# Patient Record
Sex: Female | Born: 1985 | Race: Black or African American | Hispanic: No | Smoking: Never smoker
Health system: Southern US, Community
[De-identification: ages and names within clinical notes are randomized; demographics above are authoritative.]

## PROBLEM LIST (undated history)

## (undated) DIAGNOSIS — E669 Obesity, unspecified: Secondary | ICD-10-CM

## (undated) DIAGNOSIS — R87629 Unspecified abnormal cytological findings in specimens from vagina: Secondary | ICD-10-CM

## (undated) DIAGNOSIS — N879 Dysplasia of cervix uteri, unspecified: Secondary | ICD-10-CM

## (undated) HISTORY — DX: Unspecified abnormal cytological findings in specimens from vagina: R87.629

## (undated) HISTORY — DX: Obesity, unspecified: E66.9

## (undated) HISTORY — PX: COLPOSCOPY: SHX161

## (undated) HISTORY — PX: CERVICAL BIOPSY  W/ LOOP ELECTRODE EXCISION: SUR135

## (undated) HISTORY — DX: Dysplasia of cervix uteri, unspecified: N87.9

---
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
## 2007-12-15 ENCOUNTER — Emergency Department: Payer: Self-pay | Admitting: Emergency Medicine

## 2007-12-16 ENCOUNTER — Emergency Department: Payer: Self-pay | Admitting: Internal Medicine

## 2008-01-14 ENCOUNTER — Emergency Department: Payer: Self-pay | Admitting: Emergency Medicine

## 2008-03-08 ENCOUNTER — Emergency Department: Payer: Self-pay | Admitting: Emergency Medicine

## 2008-09-05 ENCOUNTER — Emergency Department: Payer: Self-pay | Admitting: Emergency Medicine

## 2009-09-13 ENCOUNTER — Emergency Department: Payer: Self-pay | Admitting: Unknown Physician Specialty

## 2011-10-29 ENCOUNTER — Emergency Department: Payer: Self-pay | Admitting: Emergency Medicine

## 2012-01-28 ENCOUNTER — Emergency Department: Payer: Self-pay | Admitting: Emergency Medicine

## 2013-02-09 ENCOUNTER — Emergency Department: Payer: Self-pay | Admitting: Emergency Medicine

## 2013-02-09 LAB — URINALYSIS, COMPLETE
Glucose,UR: NEGATIVE mg/dL (ref 0–75)
Ketone: NEGATIVE
Nitrite: NEGATIVE
Ph: 5 (ref 4.5–8.0)
Specific Gravity: 1.027 (ref 1.003–1.030)

## 2013-02-09 LAB — COMPREHENSIVE METABOLIC PANEL
Anion Gap: 6 — ABNORMAL LOW (ref 7–16)
BUN: 19 mg/dL — ABNORMAL HIGH (ref 7–18)
Calcium, Total: 8.8 mg/dL (ref 8.5–10.1)
Chloride: 105 mmol/L (ref 98–107)
Creatinine: 0.96 mg/dL (ref 0.60–1.30)
EGFR (Non-African Amer.): >60
Glucose: 92 mg/dL (ref 65–99)
Osmolality: 278 (ref 275–301)
Potassium: 3.9 mmol/L (ref 3.5–5.1)
Sodium: 138 mmol/L (ref 136–145)
Total Protein: 8 g/dL (ref 6.4–8.2)

## 2013-02-09 LAB — PREGNANCY, URINE: Pregnancy Test, Urine: NEGATIVE m[IU]/mL

## 2013-02-09 LAB — CBC
HCT: 39.3 % (ref 35.0–47.0)
MCHC: 32.6 g/dL (ref 32.0–36.0)
MCV: 88 fL (ref 80–100)
RBC: 4.47 10*6/uL (ref 3.80–5.20)
RDW: 14.3 % (ref 11.5–14.5)

## 2013-05-19 ENCOUNTER — Emergency Department: Payer: Self-pay | Admitting: Emergency Medicine

## 2013-05-19 LAB — URINALYSIS, COMPLETE
Bilirubin,UR: NEGATIVE
Blood: NEGATIVE
Nitrite: NEGATIVE
Ph: 7 (ref 4.5–8.0)
Protein: NEGATIVE
Specific Gravity: 1.021 (ref 1.003–1.030)
Squamous Epithelial: 6
WBC UR: 3 /HPF (ref 0–5)

## 2013-05-19 LAB — PREGNANCY, URINE: Pregnancy Test, Urine: NEGATIVE m[IU]/mL

## 2013-05-19 LAB — GC/CHLAMYDIA PROBE AMP

## 2013-05-19 LAB — WET PREP, GENITAL

## 2014-02-09 LAB — HM HIV SCREENING LAB: HM HIV Screening: NEGATIVE

## 2014-10-27 ENCOUNTER — Encounter (HOSPITAL_COMMUNITY): Payer: Self-pay | Admitting: Emergency Medicine

## 2014-10-27 DIAGNOSIS — Z3202 Encounter for pregnancy test, result negative: Secondary | ICD-10-CM | POA: Insufficient documentation

## 2014-10-27 DIAGNOSIS — R110 Nausea: Secondary | ICD-10-CM | POA: Insufficient documentation

## 2014-10-27 DIAGNOSIS — N760 Acute vaginitis: Secondary | ICD-10-CM | POA: Insufficient documentation

## 2014-10-27 NOTE — ED Notes (Signed)
Pt. reports LLQ / left lateral abdominal pain onset yesterday with nausea , denies emesis or diarrhea , no dysuria , mild vaginal itching .

## 2014-10-28 ENCOUNTER — Emergency Department (HOSPITAL_COMMUNITY)
Admission: EM | Admit: 2014-10-28 | Discharge: 2014-10-28 | Disposition: A | Discharge status: Home / Self Care | Payer: Self-pay | Attending: Emergency Medicine | Admitting: Emergency Medicine

## 2014-10-28 DIAGNOSIS — B9689 Other specified bacterial agents as the cause of diseases classified elsewhere: Secondary | ICD-10-CM

## 2014-10-28 DIAGNOSIS — R102 Pelvic and perineal pain: Secondary | ICD-10-CM

## 2014-10-28 DIAGNOSIS — N760 Acute vaginitis: Secondary | ICD-10-CM

## 2014-10-28 LAB — CBC WITH DIFFERENTIAL/PLATELET
Basophils Absolute: 0 10*3/uL (ref 0.0–0.1)
Basophils Relative: 0 % (ref 0–1)
Eosinophils Absolute: 0.1 10*3/uL (ref 0.0–0.7)
Eosinophils Relative: 2 % (ref 0–5)
HEMATOCRIT: 37.6 % (ref 36.0–46.0)
Hemoglobin: 12.3 g/dL (ref 12.0–15.0)
LYMPHS ABS: 2.5 10*3/uL (ref 0.7–4.0)
LYMPHS PCT: 48 % — AB (ref 12–46)
MCH: 27.6 pg (ref 26.0–34.0)
MCHC: 32.7 g/dL (ref 30.0–36.0)
MCV: 84.3 fL (ref 78.0–100.0)
Monocytes Absolute: 0.3 10*3/uL (ref 0.1–1.0)
Monocytes Relative: 5 % (ref 3–12)
NEUTROS ABS: 2.3 10*3/uL (ref 1.7–7.7)
Neutrophils Relative %: 45 % (ref 43–77)
Platelets: 204 10*3/uL (ref 150–400)
RBC: 4.46 MIL/uL (ref 3.87–5.11)
RDW: 13 % (ref 11.5–15.5)
WBC: 5.2 10*3/uL (ref 4.0–10.5)

## 2014-10-28 LAB — WET PREP, GENITAL
Trich, Wet Prep: NONE SEEN
YEAST WET PREP: NONE SEEN

## 2014-10-28 LAB — URINALYSIS, ROUTINE W REFLEX MICROSCOPIC
Bilirubin Urine: NEGATIVE
GLUCOSE, UA: NEGATIVE mg/dL
KETONES UR: NEGATIVE mg/dL
LEUKOCYTES UA: NEGATIVE
Nitrite: NEGATIVE
PH: 5.5 (ref 5.0–8.0)
Protein, ur: NEGATIVE mg/dL
Specific Gravity, Urine: 1.02 (ref 1.005–1.030)
Urobilinogen, UA: 1 mg/dL (ref 0.0–1.0)

## 2014-10-28 LAB — COMPREHENSIVE METABOLIC PANEL
ALK PHOS: 49 U/L (ref 39–117)
ALT: 10 U/L (ref 0–35)
AST: 15 U/L (ref 0–37)
Albumin: 3.7 g/dL (ref 3.5–5.2)
Anion gap: 12 (ref 5–15)
BUN: 11 mg/dL (ref 6–23)
CHLORIDE: 102 meq/L (ref 96–112)
CO2: 25 meq/L (ref 19–32)
Calcium: 9.1 mg/dL (ref 8.4–10.5)
Creatinine, Ser: 1.04 mg/dL (ref 0.50–1.10)
GFR calc non Af Amer: 73 mL/min — ABNORMAL LOW (ref >90)
GFR, EST AFRICAN AMERICAN: 85 mL/min — AB (ref >90)
GLUCOSE: 95 mg/dL (ref 70–99)
Potassium: 3.8 mEq/L (ref 3.7–5.3)
SODIUM: 139 meq/L (ref 137–147)
Total Bilirubin: 0.4 mg/dL (ref 0.3–1.2)
Total Protein: 7 g/dL (ref 6.0–8.3)

## 2014-10-28 LAB — HIV ANTIBODY (ROUTINE TESTING W REFLEX): HIV: NONREACTIVE

## 2014-10-28 LAB — URINE MICROSCOPIC-ADD ON

## 2014-10-28 LAB — RPR

## 2014-10-28 LAB — PREGNANCY, URINE: Preg Test, Ur: NEGATIVE

## 2014-10-28 MED ORDER — TRAMADOL HCL 50 MG PO TABS: 50.0000 mg | ORAL_TABLET | Freq: Four times a day (QID) | ORAL | Status: DC | PRN

## 2014-10-28 MED ORDER — METRONIDAZOLE 500 MG PO TABS: 500.0000 mg | ORAL_TABLET | Freq: Two times a day (BID) | ORAL | Status: DC

## 2014-10-28 NOTE — Discharge Instructions (Signed)
Bacterial Vaginosis Bacterial vaginosis is a vaginal infection that occurs when the normal balance of bacteria in the vagina is disrupted. It results from an overgrowth of certain bacteria. This is the most common vaginal infection in women of childbearing age. Treatment is important to prevent complications, especially in pregnant women, as it can cause a premature delivery. CAUSES  Bacterial vaginosis is caused by an increase in harmful bacteria that are normally present in smaller amounts in the vagina. Several different kinds of bacteria can cause bacterial vaginosis. However, the reason that the condition develops is not fully understood. RISK FACTORS Certain activities or behaviors can put you at an increased risk of developing bacterial vaginosis, including:  Having a new sex partner or multiple sex partners.  Douching.  Using an intrauterine device (IUD) for contraception. Women do not get bacterial vaginosis from toilet seats, bedding, swimming pools, or contact with objects around them. SIGNS AND SYMPTOMS  Some women with bacterial vaginosis have no signs or symptoms. Common symptoms include:  Grey vaginal discharge.  A fishlike odor with discharge, especially after sexual intercourse.  Itching or burning of the vagina and vulva.  Burning or pain with urination. DIAGNOSIS  Your health care provider will take a medical history and examine the vagina for signs of bacterial vaginosis. A sample of vaginal fluid may be taken. Your health care provider will look at this sample under a microscope to check for bacteria and abnormal cells. A vaginal pH test may also be done.  TREATMENT  Bacterial vaginosis may be treated with antibiotic medicines. These may be given in the form of a pill or a vaginal cream. A second round of antibiotics may be prescribed if the condition comes back after treatment.  HOME CARE INSTRUCTIONS   Only take over-the-counter or prescription medicines as  directed by your health care provider.  If antibiotic medicine was prescribed, take it as directed. Make sure you finish it even if you start to feel better.  Do not have sex until treatment is completed.  Tell all sexual partners that you have a vaginal infection. They should see their health care provider and be treated if they have problems, such as a mild rash or itching.  Practice safe sex by using condoms and only having one sex partner. SEEK MEDICAL CARE IF:   Your symptoms are not improving after 3 days of treatment.  You have increased discharge or pain.  You have a fever. MAKE SURE YOU:   Understand these instructions.  Will watch your condition.  Will get help right away if you are not doing well or get worse. FOR MORE INFORMATION  Centers for Disease Control and Prevention, Division of STD Prevention: Appraiser[employer]raud[employer]fi American Sexual Health Association (ASHA): www.ashastd.org  Document Released: 12/03/2005 Document Revised: 09/23/2013 Document Reviewed: 07/15/2013 [facility] Patient Information 2015 [place], [place]. This information is not intended to replace advice given to you by your health care provider. Make sure you discuss any questions you have with your health care provider.  Pelvic Pain Female pelvic pain can be caused by many different things and start from a variety of places. Pelvic pain refers to pain that is located in the lower half of the abdomen and between your hips. The pain may occur over a short period of time (acute) or may be reoccurring (chronic). The cause of pelvic pain may be related to disorders affecting the female reproductive organs (gynecologic), but it may also be related to the bladder, kidney stones, an intestinal complication,  or muscle or skeletal problems. Getting help right away for pelvic pain is important, especially if there has been severe, sharp, or a sudden onset of unusual pain. It is also important to get help right away because  some types of pelvic pain can be life threatening.  CAUSES  Below are only some of the causes of pelvic pain. The causes of pelvic pain can be in one of several categories.   Gynecologic.  Pelvic inflammatory disease.  Sexually transmitted infection.  Ovarian cyst or a twisted ovarian ligament (ovarian torsion).  Uterine lining that grows outside the uterus (endometriosis).  Fibroids, cysts, or tumors.  Ovulation.  Pregnancy.  Pregnancy that occurs outside the uterus (ectopic pregnancy).  Miscarriage.  Labor.  Abruption of the placenta or ruptured uterus.  Infection.  Uterine infection (endometritis).  Bladder infection.  Diverticulitis.  Miscarriage related to a uterine infection (septic abortion).  Bladder.  Inflammation of the bladder (cystitis).  Kidney stone(s).  Gastrointestinal.  Constipation.  Diverticulitis.  Neurologic.  Trauma.  Feeling pelvic pain because of mental or emotional causes (psychosomatic).  Cancers of the bowel or pelvis. EVALUATION  Your caregiver will want to take a careful history of your concerns. This includes recent changes in your health, a careful gynecologic history of your periods (menses), and a sexual history. Obtaining your family history and medical history is also important. Your caregiver may suggest a pelvic exam. A pelvic exam will help identify the location and severity of the pain. It also helps in the evaluation of which organ system may be involved. In order to identify the cause of the pelvic pain and be properly treated, your caregiver may order tests. These tests may include:   A pregnancy test.  Pelvic ultrasonography.  An X-ray exam of the abdomen.  A urinalysis or evaluation of vaginal discharge.  Blood tests. HOME CARE INSTRUCTIONS   Only take over-the-counter or prescription medicines for pain, discomfort, or fever as directed by your caregiver.   Rest as directed by your caregiver.    Eat a balanced diet.   Drink enough fluids to make your urine clear or pale yellow, or as directed.   Avoid sexual intercourse if it causes pain.   Apply warm or cold compresses to the lower abdomen depending on which one helps the pain.   Avoid stressful situations.   Keep a journal of your pelvic pain. Write down when it started, where the pain is located, and if there are things that seem to be associated with the pain, such as food or your menstrual cycle.  Follow up with your caregiver as directed.  SEEK MEDICAL CARE IF:  Your medicine does not help your pain.  You have abnormal vaginal discharge. SEEK IMMEDIATE MEDICAL CARE IF:   You have heavy bleeding from the vagina.   Your pelvic pain increases.   You feel light-headed or faint.   You have chills.   You have pain with urination or blood in your urine.   You have uncontrolled diarrhea or vomiting.   You have a fever or persistent symptoms for more than 3 days.  You have a fever and your symptoms suddenly get worse.   You are being physically or sexually abused.  MAKE SURE YOU:  Understand these instructions.  Will watch your condition.  Will get help if you are not doing well or get worse. Document Released: 10/30/2004 Document Revised: 04/19/2014 Document Reviewed: 03/24/2012 [facility] Patient Information 2015 [place], [place]. This information is not intended to  replace advice given to you by your health care provider. Make sure you discuss any questions you have with your health care provider. ° °

## 2014-10-28 NOTE — ED Notes (Signed)
Phlebotomy at the bedside  

## 2014-10-28 NOTE — ED Notes (Signed)
Patient is alert and orientedx4.  Patient was explained discharge instructions and they understood them with no questions.   

## 2014-10-28 NOTE — ED Provider Notes (Signed)
CSN: [id number]     Arrival date & time 10/27/14  2332 History   First MD Initiated Contact with Patient 10/28/14 0051     Chief Complaint  Patient presents with  . Abdominal Pain     (Consider location/radiation/quality/duration/timing/severity/associated sxs/prior Treatment) HPI Comments: Patient presents to the ER for evaluation of abdominal and pelvic pain. Patient reports she started to feel some discomfort yesterday, then this morning it worsened. She was experiencing intermittent pain in the left lower abdomen and pelvis through the course of the morning and then this evening the pain became more intense and constant. She has not had any vomiting but did feel nausea. No diarrhea or constipation. She has not had any urinary symptoms. Denies unusual vaginal bleeding, has had a slight discharge and itching. She has not noticed any fevers.  Patient is a 28 y.o. female presenting with abdominal pain.  Abdominal Pain Associated symptoms: vaginal discharge   Associated symptoms: no fever     History reviewed. No pertinent past medical history. History reviewed. No pertinent past surgical history. No family history on file. History  Substance Use Topics  . Smoking status: Never Smoker   . Smokeless tobacco: Not on file  . Alcohol Use: No   OB History    No data available     Review of Systems  Constitutional: Negative for fever.  Gastrointestinal: Positive for abdominal pain.  Genitourinary: Positive for vaginal discharge.  All other systems reviewed and are negative.     Allergies  Review of patient's allergies indicates no known allergies.  Home Medications   Prior to Admission medications   Not on File   BP 114/66 mmHg  Pulse 80  Temp(Src) 97.7 F (36.5 C) (Oral)  Resp 16  Ht 5\' 5"  (1.651 m)  Wt 235 lb (106.595 kg)  BMI 39.11 kg/m2  SpO2 100%  LMP 10/19/2014 Physical Exam  Constitutional: She is oriented to person, place, and time. She appears  well-developed and well-nourished. No distress.  HENT:  Head: Normocephalic and atraumatic.  Right Ear: Hearing normal.  Left Ear: Hearing normal.  Nose: Nose normal.  Mouth/Throat: Oropharynx is clear and moist and mucous membranes are normal.  Eyes: Conjunctivae and EOM are normal. Pupils are equal, round, and reactive to light.  Neck: Normal range of motion. Neck supple.  Cardiovascular: Regular rhythm, S1 normal and S2 normal.  Exam reveals no gallop and no friction rub.   No murmur heard. Pulmonary/Chest: Effort normal and breath sounds normal. No respiratory distress. She exhibits no tenderness.  Abdominal: Soft. Normal appearance and bowel sounds are normal. There is no hepatosplenomegaly. There is tenderness in the suprapubic area and left lower quadrant. There is no rebound, no guarding, no tenderness at McBurney's point and negative Murphy's sign. No hernia. Hernia confirmed negative in the right inguinal area and confirmed negative in the left inguinal area.  Genitourinary: Vagina normal and uterus normal. There is no rash or lesion on the right labia. There is no rash or lesion on the left labia. Cervix exhibits discharge. Cervix exhibits no motion tenderness. Right adnexum displays no mass and no tenderness. Left adnexum displays tenderness. Left adnexum displays no mass.  Musculoskeletal: Normal range of motion.  Neurological: She is alert and oriented to person, place, and time. She has normal strength. No cranial nerve deficit or sensory deficit. Coordination normal. GCS eye subscore is 4. GCS verbal subscore is 5. GCS motor subscore is 6.  Skin: Skin is warm, dry and intact. No  rash noted. No cyanosis.  Psychiatric: She has a normal mood and affect. Her speech is normal and behavior is normal. Thought content normal.  Nursing note and vitals reviewed.   ED Course  Procedures (including critical care time) Labs Review Labs Reviewed  CBC WITH DIFFERENTIAL - Abnormal; Notable  for the following:    Lymphocytes Relative 48 (*)    All other components within normal limits  COMPREHENSIVE METABOLIC PANEL - Abnormal; Notable for the following:    GFR calc non Af Amer 73 (*)    GFR calc Af Amer 85 (*)    All other components within normal limits  PREGNANCY, URINE  URINALYSIS, ROUTINE W REFLEX MICROSCOPIC    Imaging Review No results found.   EKG Interpretation None      MDM   Final diagnoses:  None  BV Pelvic pain  Patient presents to the ER for evaluation of left lower abdominal and pelvic discomfort. Patient did not have any signs of peritonitis, a fairly benign abdominal exam. Blood work was normal including a white count of 5.2. She does not have any fever. Urinalysis did not show obvious infection. Pelvic exam was performed and she did have tenderness in the area of the left ovary without any mass or enlargement. She did have thick cervical discharge. Wet prep shows moderate clue cells and moderate WBC. She did not, however, have any cervical motion tenderness. Patient will be treated for bacterial vaginosis and follow-up with OB/GYN. Return if symptoms worsen.    Orpah Greek, MD 10/28/14 [phone]

## 2014-10-28 NOTE — ED Notes (Signed)
Pelvic cart at the bedside 

## 2014-10-29 LAB — GC/CHLAMYDIA PROBE AMP
CT Probe RNA: NEGATIVE
GC Probe RNA: NEGATIVE

## 2014-12-17 NOTE — L&D Delivery Note (Signed)
Delivery Note At 11:11 PM a viable female was delivered via Vaginal, Spontaneous Delivery  Through thick Meconium (Presentation: Left Occiput Anterior).  APGAR: 8,9 ; weight  .   Placenta status: , Pathology.  Cord: 3 vessels with the following complications: None.    Pt. GBS positive. Received Ampicillin at 2301, and delivered at 2311.   Anesthesia: None  Episiotomy: None Lacerations:  None.  Suture Repair: none.  Est. Blood Loss (mL):    Mom to postpartum.  Baby to Couplet care / Skin to Skin.  Called to delivery. Mother pushed and delivered shortly after arrival in the room. I had time merely to gown and glove. Infant delivered to maternal abdomen. Thick meconium noted. Cord clamped and cut. Active management of 3rd stage with traction and Pitocin. Placenta delivered intact with 3v cord. Very small strands of membrane noted in the vaginal vault. Swept, and removed. No tears. EBL200cc. Counts correct. Hemostatic.   Aquilla Hacker, MD Family Medicine - PGY 2    [facility] 11/05/2015, 11:50 PM

## 2015-03-15 ENCOUNTER — Encounter (HOSPITAL_COMMUNITY): Payer: Self-pay | Admitting: *Deleted

## 2015-03-15 DIAGNOSIS — Z3A00 Weeks of gestation of pregnancy not specified: Secondary | ICD-10-CM | POA: Insufficient documentation

## 2015-03-15 DIAGNOSIS — O9989 Other specified diseases and conditions complicating pregnancy, childbirth and the puerperium: Secondary | ICD-10-CM | POA: Insufficient documentation

## 2015-03-15 DIAGNOSIS — R109 Unspecified abdominal pain: Secondary | ICD-10-CM | POA: Insufficient documentation

## 2015-03-15 LAB — COMPREHENSIVE METABOLIC PANEL
ALBUMIN: 3.4 g/dL — AB (ref 3.5–5.2)
ALT: 12 U/L (ref 0–35)
ANION GAP: 9 (ref 5–15)
AST: 17 U/L (ref 0–37)
Alkaline Phosphatase: 44 U/L (ref 39–117)
BUN: 10 mg/dL (ref 6–23)
CHLORIDE: 105 mmol/L (ref 96–112)
CO2: 23 mmol/L (ref 19–32)
CREATININE: 0.89 mg/dL (ref 0.50–1.10)
Calcium: 9 mg/dL (ref 8.4–10.5)
GFR, EST NON AFRICAN AMERICAN: 89 mL/min — AB (ref >90)
GLUCOSE: 98 mg/dL (ref 70–99)
Potassium: 4.7 mmol/L (ref 3.5–5.1)
Sodium: 137 mmol/L (ref 135–145)
TOTAL PROTEIN: 5.9 g/dL — AB (ref 6.0–8.3)
Total Bilirubin: 0.3 mg/dL (ref 0.3–1.2)

## 2015-03-15 LAB — CBC WITH DIFFERENTIAL/PLATELET
BASOS ABS: 0 10*3/uL (ref 0.0–0.1)
BASOS PCT: 0 % (ref 0–1)
EOS PCT: 2 % (ref 0–5)
Eosinophils Absolute: 0.1 10*3/uL (ref 0.0–0.7)
HCT: 37.5 % (ref 36.0–46.0)
HEMOGLOBIN: 12.1 g/dL (ref 12.0–15.0)
LYMPHS ABS: 2.3 10*3/uL (ref 0.7–4.0)
Lymphocytes Relative: 49 % — ABNORMAL HIGH (ref 12–46)
MCH: 28 pg (ref 26.0–34.0)
MCHC: 32.3 g/dL (ref 30.0–36.0)
MCV: 86.8 fL (ref 78.0–100.0)
Monocytes Absolute: 0.3 10*3/uL (ref 0.1–1.0)
Monocytes Relative: 6 % (ref 3–12)
Neutro Abs: 2 10*3/uL (ref 1.7–7.7)
Neutrophils Relative %: 43 % (ref 43–77)
Platelets: 193 10*3/uL (ref 150–400)
RBC: 4.32 MIL/uL (ref 3.87–5.11)
RDW: 13.5 % (ref 11.5–15.5)
WBC: 4.7 10*3/uL (ref 4.0–10.5)

## 2015-03-15 LAB — URINALYSIS, ROUTINE W REFLEX MICROSCOPIC
Bilirubin Urine: NEGATIVE
GLUCOSE, UA: NEGATIVE mg/dL
HGB URINE DIPSTICK: NEGATIVE
Ketones, ur: NEGATIVE mg/dL
LEUKOCYTES UA: NEGATIVE
NITRITE: NEGATIVE
PH: 5.5 (ref 5.0–8.0)
PROTEIN: NEGATIVE mg/dL
Specific Gravity, Urine: 1.023 (ref 1.005–1.030)
Urobilinogen, UA: 1 mg/dL (ref 0.0–1.0)

## 2015-03-15 LAB — LIPASE, BLOOD: LIPASE: 29 U/L (ref 11–59)

## 2015-03-15 LAB — POC URINE PREG, ED: PREG TEST UR: POSITIVE — AB

## 2015-03-15 NOTE — ED Notes (Signed)
The pt is c/o abd pain for 2 days.  She reports that she thinks she is 10 weeks preg .  Her last period was feb 1st

## 2015-03-16 ENCOUNTER — Emergency Department (HOSPITAL_COMMUNITY)
Admission: EM | Admit: 2015-03-16 | Discharge: 2015-03-16 | Discharge status: Against Medical Advice | Payer: Self-pay | Attending: Emergency Medicine | Admitting: Emergency Medicine

## 2015-03-16 NOTE — ED Notes (Signed)
Called for pt in lobby x 2 - no answer.

## 2015-05-05 ENCOUNTER — Other Ambulatory Visit (HOSPITAL_COMMUNITY): Payer: Self-pay | Admitting: Physician Assistant

## 2015-05-05 DIAGNOSIS — Z3689 Encounter for other specified antenatal screening: Secondary | ICD-10-CM

## 2015-05-05 LAB — OB RESULTS CONSOLE HEPATITIS B SURFACE ANTIGEN: HEP B S AG: NEGATIVE

## 2015-05-05 LAB — OB RESULTS CONSOLE RPR: RPR: NONREACTIVE

## 2015-05-05 LAB — OB RESULTS CONSOLE ABO/RH: RH TYPE: POSITIVE

## 2015-05-05 LAB — OB RESULTS CONSOLE HIV ANTIBODY (ROUTINE TESTING): HIV: NONREACTIVE

## 2015-05-05 LAB — OB RESULTS CONSOLE ANTIBODY SCREEN: Antibody Screen: NEGATIVE

## 2015-05-05 LAB — OB RESULTS CONSOLE GC/CHLAMYDIA
CHLAMYDIA, DNA PROBE: NEGATIVE
GC PROBE AMP, GENITAL: NEGATIVE

## 2015-05-05 LAB — OB RESULTS CONSOLE RUBELLA ANTIBODY, IGM: Rubella: IMMUNE

## 2015-05-17 ENCOUNTER — Ambulatory Visit (HOSPITAL_COMMUNITY)
Admission: RE | Admit: 2015-05-17 | Discharge: 2015-05-17 | Disposition: A | Discharge status: Home / Self Care | Payer: Medicaid Other | Source: Ambulatory Visit | Attending: Physician Assistant | Admitting: Physician Assistant

## 2015-05-17 DIAGNOSIS — Z36 Encounter for antenatal screening of mother: Secondary | ICD-10-CM | POA: Diagnosis not present

## 2015-05-17 DIAGNOSIS — Z3A16 16 weeks gestation of pregnancy: Secondary | ICD-10-CM | POA: Insufficient documentation

## 2015-05-17 DIAGNOSIS — Z3689 Encounter for other specified antenatal screening: Secondary | ICD-10-CM

## 2015-05-25 ENCOUNTER — Other Ambulatory Visit (HOSPITAL_COMMUNITY): Payer: Self-pay | Admitting: Physician Assistant

## 2015-05-25 DIAGNOSIS — Z0489 Encounter for examination and observation for other specified reasons: Secondary | ICD-10-CM

## 2015-05-25 DIAGNOSIS — Z3A20 20 weeks gestation of pregnancy: Secondary | ICD-10-CM

## 2015-05-25 DIAGNOSIS — IMO0002 Reserved for concepts with insufficient information to code with codable children: Secondary | ICD-10-CM

## 2015-06-14 ENCOUNTER — Ambulatory Visit (HOSPITAL_COMMUNITY)
Admission: RE | Admit: 2015-06-14 | Discharge: 2015-06-14 | Disposition: A | Discharge status: Home / Self Care | Payer: Medicaid Other | Source: Ambulatory Visit | Attending: Physician Assistant | Admitting: Physician Assistant

## 2015-06-14 ENCOUNTER — Ambulatory Visit (HOSPITAL_COMMUNITY): Payer: Medicaid Other

## 2015-06-14 DIAGNOSIS — Z3A20 20 weeks gestation of pregnancy: Secondary | ICD-10-CM | POA: Insufficient documentation

## 2015-06-14 DIAGNOSIS — Z36 Encounter for antenatal screening of mother: Secondary | ICD-10-CM | POA: Insufficient documentation

## 2015-06-14 DIAGNOSIS — Z0489 Encounter for examination and observation for other specified reasons: Secondary | ICD-10-CM

## 2015-06-14 DIAGNOSIS — IMO0002 Reserved for concepts with insufficient information to code with codable children: Secondary | ICD-10-CM | POA: Insufficient documentation

## 2015-06-30 ENCOUNTER — Other Ambulatory Visit (HOSPITAL_COMMUNITY): Payer: Self-pay | Admitting: Physician Assistant

## 2015-06-30 DIAGNOSIS — Z3A26 26 weeks gestation of pregnancy: Secondary | ICD-10-CM

## 2015-07-26 ENCOUNTER — Other Ambulatory Visit (HOSPITAL_COMMUNITY): Payer: Self-pay | Admitting: Physician Assistant

## 2015-07-26 ENCOUNTER — Ambulatory Visit (HOSPITAL_COMMUNITY)
Admission: RE | Admit: 2015-07-26 | Discharge: 2015-07-26 | Disposition: A | Discharge status: Home / Self Care | Payer: Medicaid Other | Source: Ambulatory Visit | Attending: Physician Assistant | Admitting: Physician Assistant

## 2015-07-26 ENCOUNTER — Encounter (HOSPITAL_COMMUNITY): Payer: Self-pay

## 2015-07-26 DIAGNOSIS — E669 Obesity, unspecified: Secondary | ICD-10-CM | POA: Diagnosis not present

## 2015-07-26 DIAGNOSIS — Z0489 Encounter for examination and observation for other specified reasons: Secondary | ICD-10-CM

## 2015-07-26 DIAGNOSIS — Z3A26 26 weeks gestation of pregnancy: Secondary | ICD-10-CM | POA: Diagnosis not present

## 2015-07-26 DIAGNOSIS — Z36 Encounter for antenatal screening of mother: Secondary | ICD-10-CM | POA: Diagnosis not present

## 2015-07-26 DIAGNOSIS — IMO0002 Reserved for concepts with insufficient information to code with codable children: Secondary | ICD-10-CM

## 2015-07-26 DIAGNOSIS — O99212 Obesity complicating pregnancy, second trimester: Secondary | ICD-10-CM

## 2015-10-03 LAB — OB RESULTS CONSOLE GC/CHLAMYDIA
CHLAMYDIA, DNA PROBE: NEGATIVE
GC PROBE AMP, GENITAL: NEGATIVE

## 2015-10-03 LAB — OB RESULTS CONSOLE GBS: STREP GROUP B AG: POSITIVE

## 2015-11-01 ENCOUNTER — Telehealth (HOSPITAL_COMMUNITY): Payer: Self-pay | Admitting: *Deleted

## 2015-11-01 ENCOUNTER — Encounter (HOSPITAL_COMMUNITY): Payer: Self-pay | Admitting: *Deleted

## 2015-11-01 NOTE — Telephone Encounter (Signed)
Preadmission screen  

## 2015-11-05 ENCOUNTER — Inpatient Hospital Stay (HOSPITAL_COMMUNITY)
Admission: AD | Admit: 2015-11-05 | Discharge: 2015-11-07 | DRG: 775 | Disposition: A | Discharge status: Home / Self Care | Payer: Medicaid Other | Source: Ambulatory Visit | Attending: Family Medicine | Admitting: Family Medicine

## 2015-11-05 DIAGNOSIS — O99214 Obesity complicating childbirth: Secondary | ICD-10-CM

## 2015-11-05 DIAGNOSIS — O99824 Streptococcus B carrier state complicating childbirth: Secondary | ICD-10-CM | POA: Diagnosis present

## 2015-11-05 DIAGNOSIS — Z3A40 40 weeks gestation of pregnancy: Secondary | ICD-10-CM

## 2015-11-05 DIAGNOSIS — O99820 Streptococcus B carrier state complicating pregnancy: Secondary | ICD-10-CM

## 2015-11-05 DIAGNOSIS — IMO0001 Reserved for inherently not codable concepts without codable children: Secondary | ICD-10-CM

## 2015-11-05 DIAGNOSIS — O480 Post-term pregnancy: Secondary | ICD-10-CM

## 2015-11-05 DIAGNOSIS — E669 Obesity, unspecified: Secondary | ICD-10-CM | POA: Diagnosis present

## 2015-11-05 MED ORDER — AMPICILLIN SODIUM 1 G IJ SOLR
1.0000 g | INTRAMUSCULAR | Status: DC
  Filled 2015-11-05 (×4): qty 1000

## 2015-11-05 MED ORDER — OXYTOCIN 40 UNITS IN LACTATED RINGERS INFUSION - SIMPLE MED
INTRAVENOUS | Status: AC
  Administered 2015-11-05: 40 [IU]
  Filled 2015-11-05: qty 1000

## 2015-11-05 MED ORDER — FENTANYL CITRATE (PF) 100 MCG/2ML IJ SOLN: 50.0000 ug | INTRAMUSCULAR | Status: DC | PRN

## 2015-11-05 MED ORDER — SODIUM CHLORIDE 0.9 % IV SOLN
2.0000 g | Freq: Once | INTRAVENOUS | Status: AC
  Administered 2015-11-05: 2 g via INTRAVENOUS
  Filled 2015-11-05: qty 2000

## 2015-11-05 MED ORDER — LIDOCAINE HCL (PF) 1 % IJ SOLN
INTRAMUSCULAR | Status: AC
  Filled 2015-11-05: qty 30

## 2015-11-05 NOTE — H&P (Signed)
Adrienne Clarke is a 29 y.o. female G3P2002 with IUP at [redacted]w[redacted]d presenting for contractions with subsequent ROM in the MAU. Pt states she has been having regular, every 5 minutes contractions, associated with none vaginal bleeding.  Membranes are intact, ruptured, with active fetal movement.   [facility] at [facility] since 17 wks 4days.   Pregnancy complicated by GBS+ status, PAP 05/05/2015 with LSIL > Colpo on 06/16/2015, Obesity.   ROM in the MAU at 2158. Contractions q 3 minutes. Pt. Feeling pressure.   Prenatal History/Complications:  Past Medical History: Past Medical History  Diagnosis Date  . Vaginal Pap smear, abnormal   . Obesity     Past Surgical History: No past surgical history on file.  Obstetrical History: OB History    Gravida Para Term Preterm AB TAB SAB Ectopic Multiple Living   3 2 2       2       Social History: Social History   Social History  . Marital Status: Single    Spouse Name: N/A  . Number of Children: N/A  . Years of Education: N/A   Social History Main Topics  . Smoking status: Never Smoker   . Smokeless tobacco: Not on file  . Alcohol Use: No  . Drug Use: No  . Sexual Activity: Not on file   Other Topics Concern  . Not on file   Social History Narrative    Family History: No family history on file.  Allergies: No Known Allergies  Prescriptions prior to admission  Medication Sig Dispense Refill Last Dose  . Prenatal Vit-Fe Fumarate-FA (PRENATAL MULTIVITAMIN) TABS tablet Take 1 tablet by mouth daily.   11/05/2015 at Unknown time     Review of Systems   Constitutional: Negative.   Blood pressure 132/73, pulse 86, resp. rate 18, last menstrual period 01/02/2015. General appearance: alert, cooperative and no distress Lungs: clear to auscultation bilaterally Heart: regular rate and rhythm Abdomen: soft, non-tender; bowel sounds normal Pelvic: 6cm, 100%, -1.  Extremities: Homans sign is negative, no sign of DVT Presentation:  cephalic Fetal monitoringBaseline: 135 bpm, Variability: Good {> 6 bpm) and Accelerations: Reactive Uterine activityFrequency: Every 5 minutes and Intensity: moderate Dilation: 10 Effacement (%): 100 Station: +1, +2 Exam by:: Adrienne Better, RN   Prenatal labs: ABO, Rh: O/Positive/-- (05/19 0000) Antibody: Negative (05/19 0000) Rubella: !Error! RPR: Nonreactive (05/19 0000)  HBsAg: Negative (05/19 0000)  HIV: Non-reactive (05/19 0000)  GBS: Positive (10/17 0000)  1 hr Glucola 101 Genetic screening  Declined.  Anatomy US EICF, 43%tile.    Prenatal Transfer Tool  Maternal Diabetes: No Genetic Screening: Declined Maternal Ultrasounds/Referrals: Isolated Echogenic Intracardiac Focus Fetal Ultrasounds or other Referrals:  None Maternal Substance Abuse:  No Significant Maternal Medications:  None Significant Maternal Lab Results: Lab values include: Group B Strep positive Inadequately treated.      No results found for this or any previous visit (from the past 24 hour(s)).  Assessment: Adrienne Clarke is a 29 y.o. G3P2002 at [redacted]w[redacted]d by 85 w U/S here for active labor.  #Labor: Plan to admit to birthing suites. Progressing well on her own. Augment as needed.  #Pain: Fentanyl. No epidural.  #FWB: Cat I #ID:  GBS positive. Ampicillin ordered given presentation in active labor;  given at 2301. Delivery at 2311.  #MOF: Breast / Bottle.  #MOC: Undecided.  #Circ:  N/A  Of note, this is a late H&P entry due to multiple deliveries occurring at once. Pt. Karnes delivered shortly after  arrival to the L&D floor. See separate delivery note.   Adrienne Clarke 11/05/2015, 11:37 PM   OB FELLOW HISTORY AND PHYSICAL ATTESTATION  I have seen and examined this patient; I agree with above documentation in the resident's note.    Adrienne Clarke 11/06/2015, 12:48 AM

## 2015-11-05 NOTE — MAU Note (Signed)
Pt having ctx on and off all day. Water broke at registration light mech stained. Good fetal movement reported.

## 2015-11-06 ENCOUNTER — Inpatient Hospital Stay (HOSPITAL_COMMUNITY): Admission: RE | Admit: 2015-11-06 | Payer: Medicaid Other | Source: Ambulatory Visit

## 2015-11-06 ENCOUNTER — Encounter (HOSPITAL_COMMUNITY): Payer: Self-pay

## 2015-11-06 LAB — CBC
HEMATOCRIT: 34.7 % — AB (ref 36.0–46.0)
HEMATOCRIT: 36.4 % (ref 36.0–46.0)
HEMOGLOBIN: 11.8 g/dL — AB (ref 12.0–15.0)
Hemoglobin: 11.1 g/dL — ABNORMAL LOW (ref 12.0–15.0)
MCH: 28 pg (ref 26.0–34.0)
MCH: 28.4 pg (ref 26.0–34.0)
MCHC: 32 g/dL (ref 30.0–36.0)
MCHC: 32.4 g/dL (ref 30.0–36.0)
MCV: 87.4 fL (ref 78.0–100.0)
MCV: 87.7 fL (ref 78.0–100.0)
Platelets: 185 10*3/uL (ref 150–400)
Platelets: 192 10*3/uL (ref 150–400)
RBC: 3.97 MIL/uL (ref 3.87–5.11)
RBC: 4.15 MIL/uL (ref 3.87–5.11)
RDW: 14.4 % (ref 11.5–15.5)
RDW: 14.5 % (ref 11.5–15.5)
WBC: 10.7 10*3/uL — ABNORMAL HIGH (ref 4.0–10.5)
WBC: 9.9 10*3/uL (ref 4.0–10.5)

## 2015-11-06 LAB — ABO/RH: ABO/RH(D): O POS

## 2015-11-06 LAB — RPR: RPR Ser Ql: NONREACTIVE

## 2015-11-06 LAB — TYPE AND SCREEN
ABO/RH(D): O POS
ANTIBODY SCREEN: NEGATIVE

## 2015-11-06 MED ORDER — ZOLPIDEM TARTRATE 5 MG PO TABS: 5.0000 mg | ORAL_TABLET | Freq: Every evening | ORAL | Status: DC | PRN

## 2015-11-06 MED ORDER — BENZOCAINE-MENTHOL 20-0.5 % EX AERO
1.0000 "application " | INHALATION_SPRAY | CUTANEOUS | Status: DC | PRN
  Filled 2015-11-06: qty 56

## 2015-11-06 MED ORDER — SIMETHICONE 80 MG PO CHEW: 80.0000 mg | CHEWABLE_TABLET | ORAL | Status: DC | PRN

## 2015-11-06 MED ORDER — ACETAMINOPHEN 325 MG PO TABS: 650.0000 mg | ORAL_TABLET | ORAL | Status: DC | PRN

## 2015-11-06 MED ORDER — WITCH HAZEL-GLYCERIN EX PADS: 1.0000 "application " | MEDICATED_PAD | CUTANEOUS | Status: DC | PRN

## 2015-11-06 MED ORDER — IBUPROFEN 600 MG PO TABS
600.0000 mg | ORAL_TABLET | Freq: Four times a day (QID) | ORAL | Status: DC
  Administered 2015-11-06 – 2015-11-07 (×7): 600 mg via ORAL
  Filled 2015-11-06 (×7): qty 1

## 2015-11-06 MED ORDER — DIBUCAINE 1 % RE OINT: 1.0000 "application " | TOPICAL_OINTMENT | RECTAL | Status: DC | PRN

## 2015-11-06 MED ORDER — LANOLIN HYDROUS EX OINT: TOPICAL_OINTMENT | CUTANEOUS | Status: DC | PRN

## 2015-11-06 MED ORDER — ONDANSETRON HCL 4 MG PO TABS: 4.0000 mg | ORAL_TABLET | ORAL | Status: DC | PRN

## 2015-11-06 MED ORDER — DIPHENHYDRAMINE HCL 25 MG PO CAPS: 25.0000 mg | ORAL_CAPSULE | Freq: Four times a day (QID) | ORAL | Status: DC | PRN

## 2015-11-06 MED ORDER — TETANUS-DIPHTH-ACELL PERTUSSIS 5-2.5-18.5 LF-MCG/0.5 IM SUSP: 0.5000 mL | Freq: Once | INTRAMUSCULAR | Status: DC

## 2015-11-06 MED ORDER — OXYCODONE-ACETAMINOPHEN 5-325 MG PO TABS: 2.0000 | ORAL_TABLET | ORAL | Status: DC | PRN

## 2015-11-06 MED ORDER — PRENATAL MULTIVITAMIN CH
1.0000 | ORAL_TABLET | Freq: Every day | ORAL | Status: DC
  Administered 2015-11-06 – 2015-11-07 (×2): 1 via ORAL
  Filled 2015-11-06 (×2): qty 1

## 2015-11-06 MED ORDER — SENNOSIDES-DOCUSATE SODIUM 8.6-50 MG PO TABS
2.0000 | ORAL_TABLET | ORAL | Status: DC
  Administered 2015-11-07: 2 via ORAL
  Filled 2015-11-06: qty 2

## 2015-11-06 MED ORDER — ONDANSETRON HCL 4 MG/2ML IJ SOLN: 4.0000 mg | INTRAMUSCULAR | Status: DC | PRN

## 2015-11-06 MED ORDER — OXYCODONE-ACETAMINOPHEN 5-325 MG PO TABS: 1.0000 | ORAL_TABLET | ORAL | Status: DC | PRN

## 2015-11-06 NOTE — Progress Notes (Deleted)
Parents requested PKU and Congenital Heart Screen not be done until after breakfast due to "being tired and not having any sleep".

## 2015-11-06 NOTE — Progress Notes (Signed)
POSTPARTUM PROGRESS NOTE  Post Partum Day 1 Subjective:  Adrienne Clarke is a 29 y.o. G3P3003 [redacted]w[redacted]d s/p nsvd.  No acute events overnight.  Pt denies problems with ambulating, voiding or po intake.  She denies nausea or vomiting.  Pain is well controlled.  She has had flatus. She has not had bowel movement.  Lochia Small.   Objective: Blood pressure 118/52, pulse 64, temperature 98.4 F (36.9 C), temperature source Oral, resp. rate 18, last menstrual period 01/02/2015, unknown if currently breastfeeding.  Physical Exam:  General: alert, cooperative and no distress Lochia:normal flow Chest: CTAB Heart: RRR no m/r/g Abdomen: +BS, soft, nontender,  Uterine Fundus: firm,  DVT Evaluation: No calf swelling or tenderness Extremities: trace edema   Recent Labs  11/06/15 0035 11/06/15 0525  HGB 11.8* 11.1*  HCT 36.4 34.7*    Assessment/Plan:  ASSESSMENT: Adrienne Clarke is a 29 y.o. G3P3003 [redacted]w[redacted]d s/p nsvd, doing well. GBS pos, indadeq tx.  Plan for discharge tomorrow   LOS: 1 day   Desma Maxim 11/06/2015, 6:22 AM

## 2015-11-06 NOTE — Progress Notes (Signed)
UR chart review completed.  

## 2015-11-06 NOTE — Lactation Note (Signed)
This note was copied from the chart of Adrienne Cicilia Louissaint. Lactation Consultation Note  Patient Name: Adrienne Clarke [id number] Date: 11/06/2015 Reason for consult: Initial assessment Experienced bf mom that wants to bf and formula feed. She does not feel like baby is getting enough breast milk and wants baby to take a bottle. She reports she knows how to manually express and how to properly latch baby. Discussed baby belly size, milk transition, nipple care, and baby behavior. Given lactation handouts. She is aware of OP services and support group. She will call as needed for bf help.    Maternal Data    Feeding Feeding Type: Bottle Fed - Formula Length of feed: 4 min  LATCH Score/Interventions                      Lactation Tools Discussed/Used WIC Program: Yes [facility])   Consult Status Consult Status: PRN    Denzil Hughes 11/06/2015, 5:09 PM

## 2015-11-07 DIAGNOSIS — O99820 Streptococcus B carrier state complicating pregnancy: Secondary | ICD-10-CM

## 2015-11-07 DIAGNOSIS — IMO0001 Reserved for inherently not codable concepts without codable children: Secondary | ICD-10-CM

## 2015-11-07 MED ORDER — IBUPROFEN 600 MG PO TABS: 600.0000 mg | ORAL_TABLET | Freq: Four times a day (QID) | ORAL | Status: DC

## 2015-11-07 NOTE — Discharge Summary (Signed)
OB Discharge Summary     Patient Name: Adrienne Clarke DOB: 1986-06-17 MRN: [id number]  Date of admission: 11/05/2015 Delivering MD: Aquilla Hacker   Date of discharge: 11/07/2015  Admitting diagnosis: 40.6 WKS, CTXS, WATER BROKE Intrauterine pregnancy: [redacted]w[redacted]d     Secondary diagnosis:  Principal Problem:   NSVD (normal spontaneous vaginal delivery) Active Problems:   Term pregnancy delivered   GBS (group B Streptococcus carrier), +RV culture, currently pregnant   Status post vaginal delivery  Additional problems:  GBS+ status with inadequate treatment prior to delivery.  LSIL > s/p colposcopy.      Discharge diagnosis: Term Pregnancy Delivered                                                                                                Post partum procedures:none  Augmentation: none  Complications: None  Hospital course:  Onset of Labor With Vaginal Delivery     29 y.o. yo G3P3003 at [redacted]w[redacted]d was admitted in Active Laboron 11/05/2015. Patient had an uncomplicated labor course as follows:  Membrane Rupture Time/Date: 9:52 PM ,11/05/2015   Intrapartum Procedures: Episiotomy: None [1]                                         Lacerations:  None [1]  Patient had a delivery of a Viable infant. 11/05/2015  Information for the patient's newborn:  Denesha, Denk [id number]  Delivery Method: Vaginal, Spontaneous Delivery (Filed from Delivery Summary)    Pateint had an uncomplicated postpartum course.  She is ambulating, tolerating a regular diet, passing flatus, and urinating well. Patient is discharged home in stable condition on 11/07/2015     Physical exam  Filed Vitals:   11/06/15 0500 11/06/15 1058 11/06/15 1800 11/07/15 0700  BP: 111/59 105/56 116/75 125/74  Pulse: 61 80 69 67  Temp: 98.4 F (36.9 C) 98.6 F (37 C) 98 F (36.7 C) 98.4 F (36.9 C)  TempSrc: Oral  Oral   Resp: 18 20 18 18    General: alert, cooperative and no distress Lochia:  appropriate Uterine Fundus: firm Incision: N/A DVT Evaluation: No evidence of DVT seen on physical exam. Labs: Lab Results  Component Value Date   WBC 10.7* 11/06/2015   HGB 11.1* 11/06/2015   HCT 34.7* 11/06/2015   MCV 87.4 11/06/2015   PLT 192 11/06/2015   CMP Latest Ref Rng 03/15/2015  Glucose 70 - 99 mg/dL 98  BUN 6 - 23 mg/dL 10  Creatinine 0.50 - 1.10 mg/dL 0.89  Sodium 135 - 145 mmol/L 137  Potassium 3.5 - 5.1 mmol/L 4.7  Chloride 96 - 112 mmol/L 105  CO2 19 - 32 mmol/L 23  Calcium 8.4 - 10.5 mg/dL 9.0  Total Protein 6.0 - 8.3 g/dL 5.9(L)  Total Bilirubin 0.3 - 1.2 mg/dL 0.3  Alkaline Phos 39 - 117 U/L 44  AST 0 - 37 U/L 17  ALT 0 - 35 U/L 12    Discharge instruction: per After Visit Summary and "  Baby and Me Booklet".  After visit meds:    Medication List    TAKE these medications        ibuprofen 600 MG tablet  Commonly known as:  ADVIL,MOTRIN  Take 1 tablet (600 mg total) by mouth every 6 (six) hours.     prenatal multivitamin Tabs tablet  Take 1 tablet by mouth daily.        Diet: routine diet  Activity: Advance as tolerated. Pelvic rest for 6 weeks.   Outpatient follow up:6 weeks Follow up Appt: needs to be scheduled Postpartum contraception: Undecided  Newborn Data: Live born female  Birth Weight: 7 lb 2.1 oz (3235 g) APGAR: 9, 9  Baby Feeding: Bottle and Breast Disposition:home with mother   11/07/2015 Paula Compton, MD  OB fellow attestation I have seen and examined this patient and agree with above documentation in the resident's note.   Adrienne Clarke is a 29 y.o. [id number] s/p NSVD.   Pain is well controlled.  Plan for birth control is undecided.  Method of Feeding: breast/formula  PE:  BP 125/74 mmHg  Pulse 67  Temp(Src) 98.4 F (36.9 C) (Oral)  Resp 18  LMP 01/02/2015  Breastfeeding? Unknown Gen: well appearing Heart: reg rate Lungs: normal WOB Fundus firm Ext: soft, no pain, no edema   Recent Labs   11/06/15 0035 11/06/15 0525  HGB 11.8* 11.1*  HCT 36.4 34.7*   Plan: discharge today - postpartum care discussed - f/u clinic in 6 weeks for postpartum visit  Caren Macadam, MD 9:32 AM

## 2015-11-07 NOTE — Discharge Instructions (Signed)

## 2015-11-07 NOTE — Lactation Note (Signed)
This note was copied from the chart of Adrienne Clarke. Lactation Consultation Note  Patient Name: Adrienne Keymani Brockschmidt [id number] Date: 11/07/2015   Visited with Mom on day of discharge, baby 74 hrs old.  Mom giving formula as well as breast feeding.  Encouraged her to place baby skin to skin and feed baby often on cue.  Engorgement prevention and treatment discussed.  Reviewed OP Lactation support available to her.  To call prn.   Broadus John 11/07/2015, 12:02 PM

## 2016-02-17 IMAGING — US US OB FOLLOW-UP
1 series · 12 of 28 positions shown · non-contrast
Comparison: none

[Series 1: us ob follow up · 48 acquisitions, 12 frames shown]
[im 2/48]
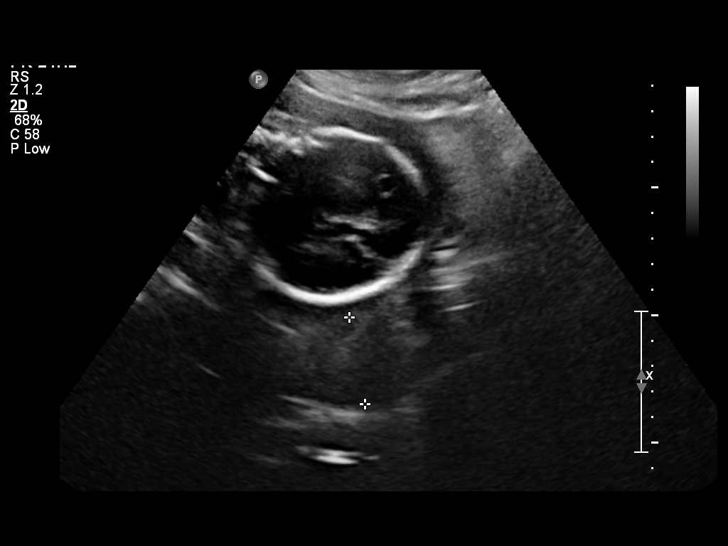
[im 6/48]
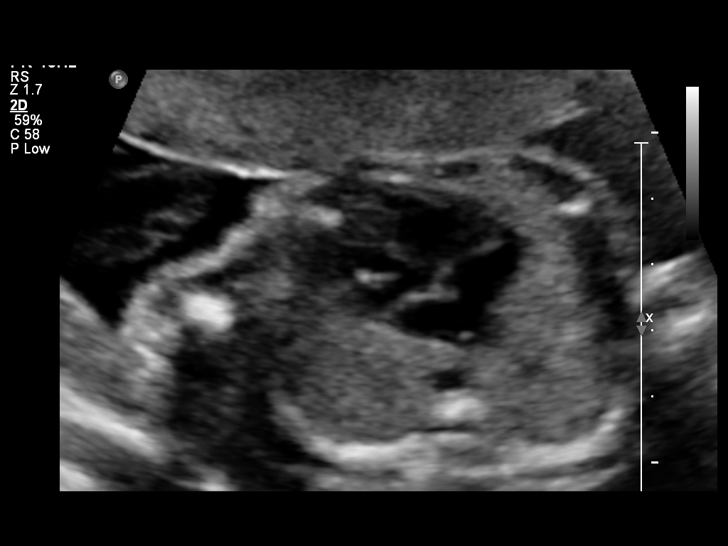
[im 9/48]
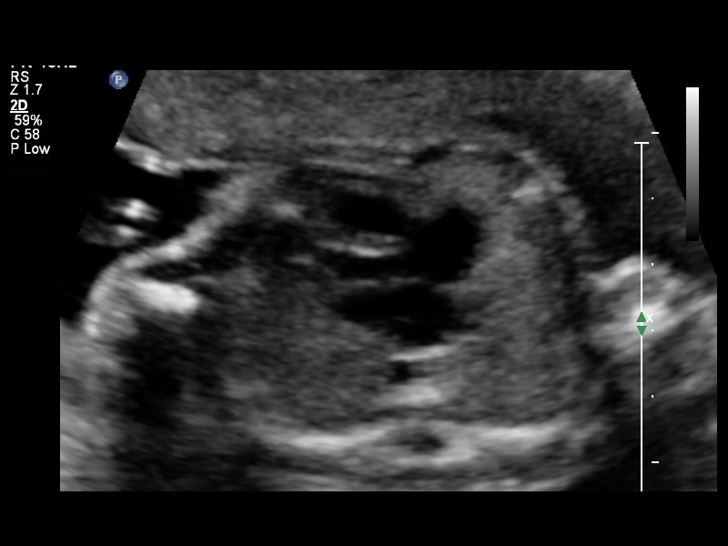
[im 14/48]
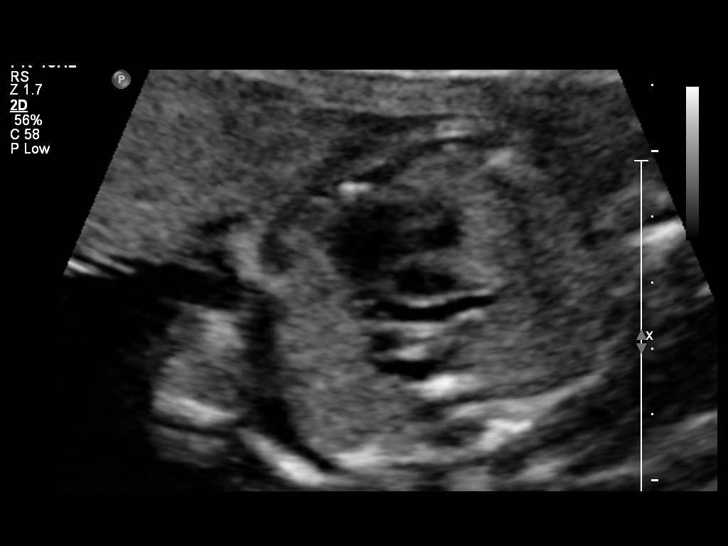
[im 18/48]
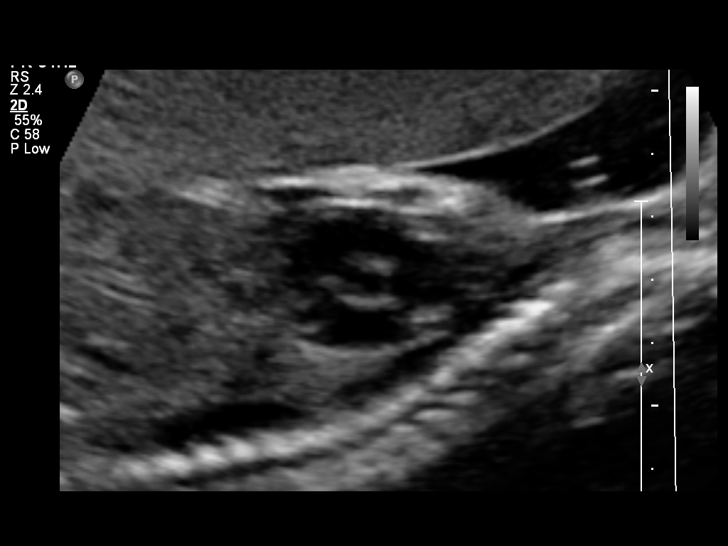
[im 21/48]
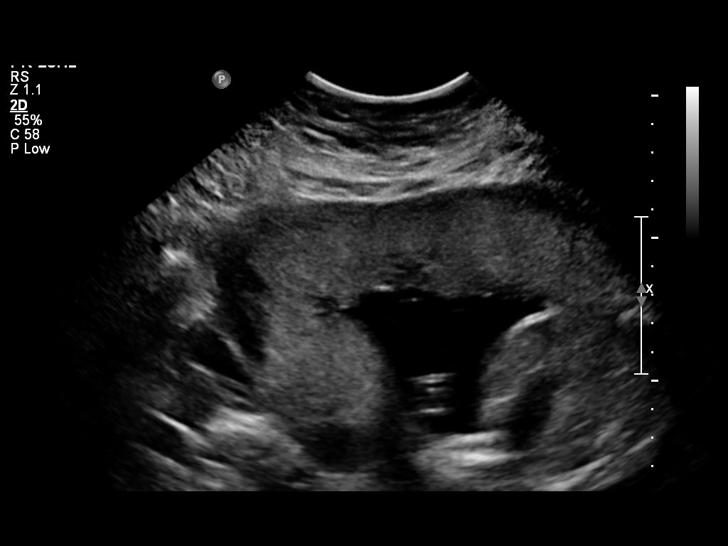
[im 27/48]
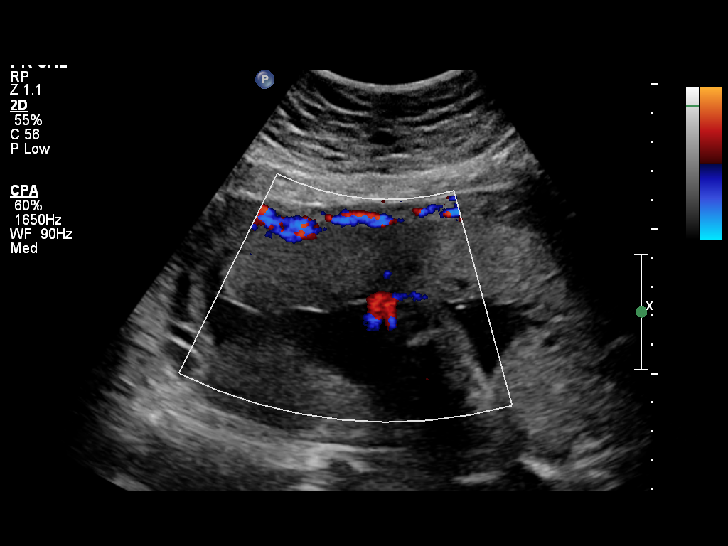
[im 30/48]
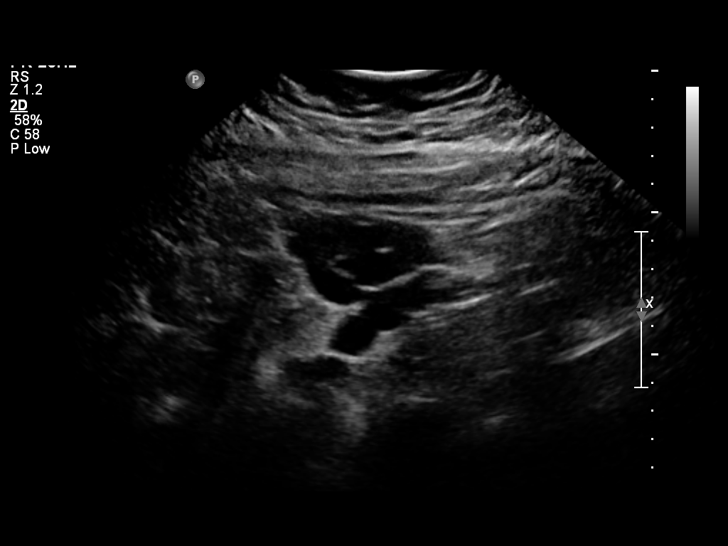
[im 34/48]
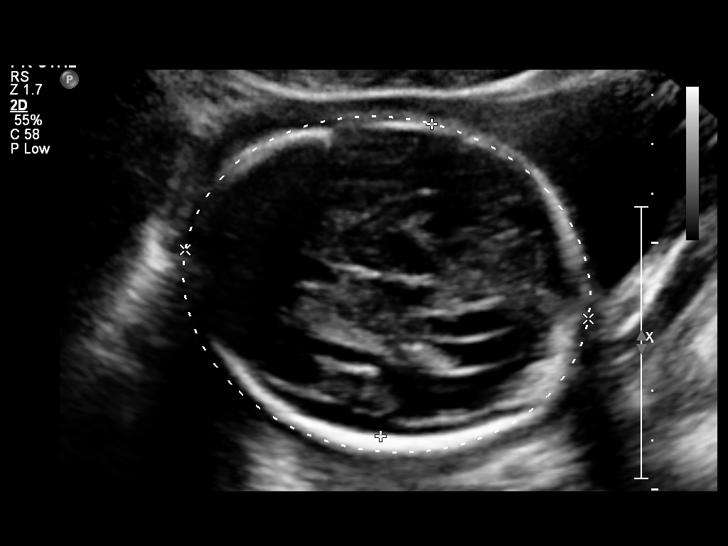
[im 39/48]
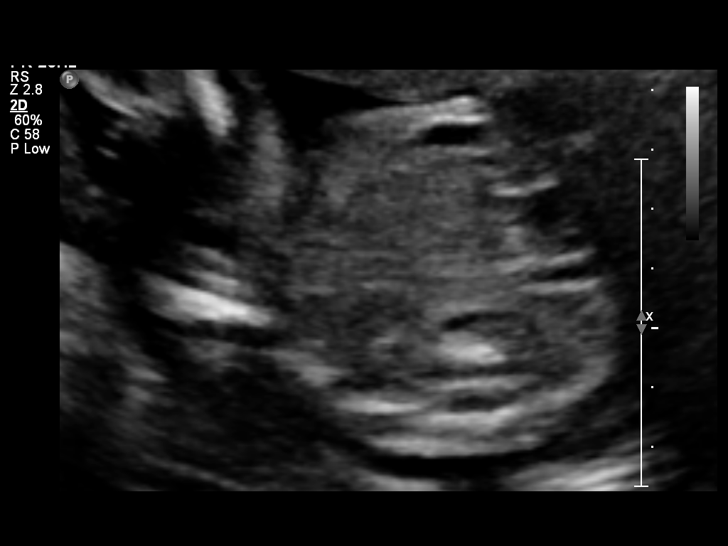
[im 42/48]
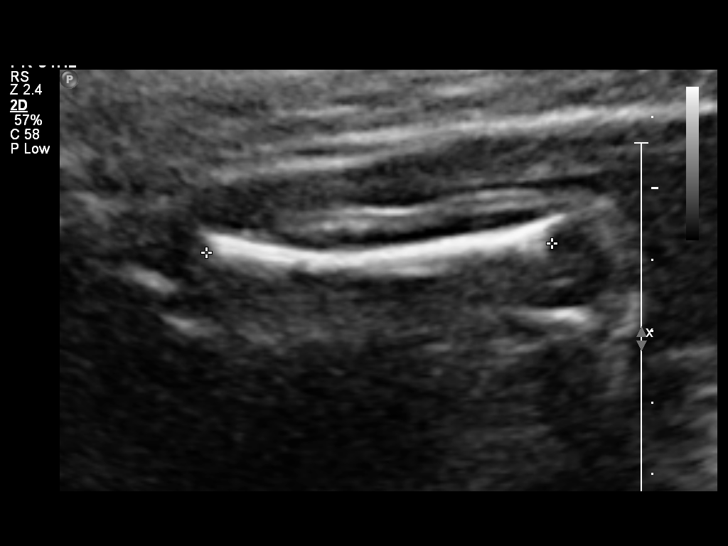
[im 46/48]
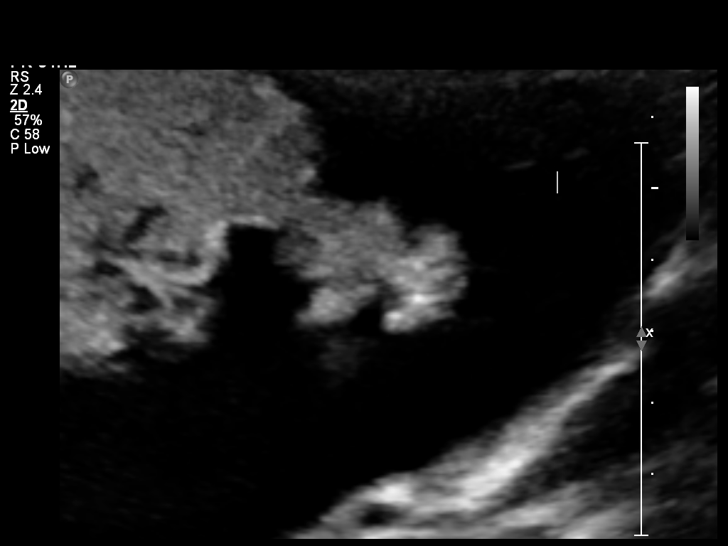

[12 of 28 positions shown; findings below may reference images not displayed]

OBSTETRICS REPORT
(Signed Final 07/26/2015 [DATE])

Name:       GREIPUR FLEMMINGSSON                       Visit  07/26/2015 [DATE]
Date:

Service(s) Provided

US OB FOLLOW UP                                        76816.1
Indications

26 weeks gestation of pregnancy
Follow-up incomplete fetal anatomic evaluation         Z36
Obesity complicating pregnancy, second
trimester
Fetal Evaluation

Num Of             1
Fetuses:
Fetal Heart        150                          bpm
Rate:
Cardiac Activity:  Observed
Presentation:      Cephalic
Placenta:          Anterior, above cervical
os
P. Cord            Previously Visualized
Insertion:

Amniotic Fluid
AFI FV:      Subjectively within normal limits
Larg Pckt:      6.6  cm
Biometry

BPD:     64.4   m    G. Age:   26w 0d                 CI:         72.8   70 - 86
m
FL/HC:      20.5   18.6 -
20.4
HC:       240   m    G. Age:   26w 1d        19  %    HC/AC:      1.16   1.04 -
m
AC:     206.2   m    G. Age:   25w 2d        14  %    FL/BPD      76.2   71 - 87
m                                     :
FL:      49.1   m    G. Age:   26w 4d        43  %    FL/AC:      23.8   20 - 24
m
HUM:     48.8   m    G. Age:   28w 5d      > 95  %
m
Est.         855   gm   1 lb 14 oz      43   %
FW:
Gestational Age

LMP:           29w 2d        Date:  01/02/15                  EDD:   10/09/15
U/S Today:     26w 0d                                         EDD:   11/01/15
Best:          26w 2d    Det. By:   U/S (05/17/15)            EDD:   10/30/15
Anatomy

Cranium:          Appears normal         Aortic Arch:       Previously seen
Fetal Cavum:      Previously seen        Ductal Arch:       Appears normal
Ventricles:       Appears normal         Diaphragm:         Previously seen
Choroid Plexus:   Previously seen        Stomach:           Appears normal,
left sided
Cerebellum:       Previously seen        Abdomen:           Appears normal
Posterior         Previously seen        Abdominal          Previously seen
Fossa:                                   Wall:
Nuchal Fold:      Not applicable (>20    Cord Vessels:      Previously seen
wks GA)
Face:             Appears normal         Kidneys:           Appear normal
(orbits and profile)
Lips:             Appears normal         Bladder:           Appears normal
Heart:            Echogenic focus        Spine:             Previously seen
in LV, NL 4CH
RVOT:             Appears normal         Lower              Previously seen
Extremities:
LVOT:             Previously seen        Upper              Previously seen
Extremities:

Other:   Female gender previously seen. Heels previously seen. Technically
difficult due to maternal habitus and fetal position.
Cervix Uterus Adnexa

Cervical Length:    3.4       cm

Cervix:       Normal appearance by transabdominal scan.

Adnexa:     No abnormality visualized.
Impression

SIUP at 54w5d
EFW 43rd%'le
No structural defects seen
Apparently isolated echogenic intracardiac focus (does not
significantly change patient's a priori risk status in
isolation; patient remains low risk)
no previa
AFI is gestational age appropriate
Recommendations

Follow up ultrasounds as clinically indicated, noting that if
fundal heights are not felt to be accurate, I would
recommend consideration for follow up interval growth in 6-8
weeks.

## 2016-02-28 ENCOUNTER — Encounter: Payer: Self-pay | Admitting: Emergency Medicine

## 2016-02-28 ENCOUNTER — Emergency Department
Admission: EM | Admit: 2016-02-28 | Discharge: 2016-02-28 | Disposition: A | Discharge status: Home / Self Care | Payer: Medicaid Other | Attending: Emergency Medicine | Admitting: Emergency Medicine

## 2016-02-28 DIAGNOSIS — Z79899 Other long term (current) drug therapy: Secondary | ICD-10-CM | POA: Insufficient documentation

## 2016-02-28 DIAGNOSIS — Z791 Long term (current) use of non-steroidal anti-inflammatories (NSAID): Secondary | ICD-10-CM | POA: Insufficient documentation

## 2016-02-28 DIAGNOSIS — J020 Streptococcal pharyngitis: Secondary | ICD-10-CM | POA: Insufficient documentation

## 2016-02-28 LAB — POCT RAPID STREP A: Streptococcus, Group A Screen (Direct): POSITIVE — AB

## 2016-02-28 MED ORDER — MAGIC MOUTHWASH W/LIDOCAINE: 5.0000 mL | Freq: Four times a day (QID) | ORAL | Status: DC | PRN

## 2016-02-28 MED ORDER — AMOXICILLIN 875 MG PO TABS: 875.0000 mg | ORAL_TABLET | Freq: Two times a day (BID) | ORAL | Status: DC

## 2016-02-28 NOTE — ED Notes (Signed)
Reports sore throat, bodyaches and fever x 2 days.  NAD

## 2016-02-28 NOTE — ED Notes (Signed)
Pt in via triage, pt reports sore throat, fever, chills, decreased appetite x 2 days.  Pt reports nausea, vomiting x 2 yesterday.  Pt A/Ox4, no immediate distress at this time.

## 2016-02-28 NOTE — Discharge Instructions (Signed)

## 2016-02-28 NOTE — ED Provider Notes (Signed)
[facility] Emergency Department Provider Note  ____________________________________________  Time seen: Approximately 11:12 AM  I have reviewed the triage vital signs and the nursing notes.   HISTORY  Chief Complaint Sore Throat    HPI Adrienne Clarke is a 30 y.o. female , NAD, presents to the emergency department with 2 day history of sudden onset sore throat, body aches and fever. Has had some chills. Some nausea and vomiting yesterday. Denies any specific sick contacts but notes that her significant other was ill recently but did not seek medical treatment. Denies abdominal pain, headache, chest pain, back pain. Has not had any shortness of breath or wheezing. Denies chest congestion, nasal congestion, runny nose, ear pain. Has had some decreased appetite and some difficulty swallowing due to throat pain. Denies any throat swelling.   Past Medical History  Diagnosis Date  . Vaginal Pap smear, abnormal   . Obesity     Patient Active Problem List   Diagnosis Date Noted  . GBS (group B Streptococcus carrier), +RV culture, currently pregnant 11/07/2015  . Status post vaginal delivery 11/07/2015  . NSVD (normal spontaneous vaginal delivery) 11/07/2015  . Term pregnancy delivered 11/06/2015    History reviewed. No pertinent past surgical history.  Current Outpatient Rx  Name  Route  Sig  Dispense  Refill  . amoxicillin (AMOXIL) 875 MG tablet   Oral   Take 1 tablet (875 mg total) by mouth 2 (two) times daily.   20 tablet   0   . ibuprofen (ADVIL,MOTRIN) 600 MG tablet   Oral   Take 1 tablet (600 mg total) by mouth every 6 (six) hours.   30 tablet   0   . magic mouthwash w/lidocaine SOLN   Oral   Take 5 mLs by mouth 4 (four) times daily as needed for mouth pain.   240 mL   0     Please mix 80mL diphenhydramine, 40mL nystatin, 80 ...   . Prenatal Vit-Fe Fumarate-FA (PRENATAL MULTIVITAMIN) TABS tablet   Oral   Take 1 tablet by mouth daily.          Allergies Review of patient's allergies indicates no known allergies.  History reviewed. No pertinent family history.  Social History Social History  Substance Use Topics  . Smoking status: Never Smoker   . Smokeless tobacco: None  . Alcohol Use: No     Review of Systems  Constitutional: Positive fever/chills, fatigue Eyes: No visual changes. No discharge ENT: Positive sore throat. No nasal congestion, runny nose, sinus pressure, ear pain. Cardiovascular: No chest pain. Respiratory: No cough. No shortness of breath. No wheezing.  Gastrointestinal: As of nausea, vomiting. No abdominal pain.   No diarrhea.  No constipation. Musculoskeletal: Positive general myalgias. Skin: Negative for rash. Neurological: Negative for headaches, focal weakness or numbness. 10-point ROS otherwise negative.  ____________________________________________   PHYSICAL EXAM:  VITAL SIGNS: ED Triage Vitals  Enc Vitals Group     BP 02/28/16 1059 120/69 mmHg     Pulse Rate 02/28/16 1059 92     Resp 02/28/16 1059 20     Temp 02/28/16 1059 99.9 F (37.7 C)     Temp Source 02/28/16 1059 Oral     SpO2 02/28/16 1059 100 %     Weight 02/28/16 1059 242 lb (109.77 kg)     Height 02/28/16 1059 5\' 6"  (1.676 m)     Head Cir --      Peak Flow --  Pain Score 02/28/16 1059 8     Pain Loc --      Pain Edu? --      Excl. in [place]? --      Constitutional: Alert and oriented. Ill appearing but in no acute distress. Eyes: Conjunctivae are normal. PERRL. EOMI without pain.  Head: Atraumatic. ENT:      Ears: TMs visualized bilaterally without effusion, erythema, bulging, perforation.      Nose: No congestion/rhinnorhea.      Mouth/Throat: Pharynx and bilateral tonsils with moderate erythema. Tonsils with gray exudates. Mucous membranes are moist.  Neck: No stridor. Supple with full range of motion. Hematological/Lymphatic/Immunilogical: Positive bilateral anterior cervical lymphadenopathy with  mild tenderness to palpation but all are mobile. Cardiovascular: Normal rate, regular rhythm. Normal S1 and S2.  Good peripheral circulation. Respiratory: Normal respiratory effort without tachypnea or retractions. Lungs CTAB. Neurologic:  Normal speech and language. No gross focal neurologic deficits are appreciated.  Skin:  Skin is warm, dry and intact. No rash noted. Psychiatric: Mood and affect are normal. Speech and behavior are normal. Patient exhibits appropriate insight and judgement.   ____________________________________________   LABS (all labs ordered are listed, but only abnormal results are displayed)  Labs Reviewed  POCT RAPID STREP A - Abnormal; Notable for the following:    Streptococcus, Group A Screen (Direct) POSITIVE (*)    All other components within normal limits   ____________________________________________  EKG  None ____________________________________________  RADIOLOGY  None ____________________________________________    PROCEDURES  Procedure(s) performed: None    Medications - No data to display   ____________________________________________   INITIAL IMPRESSION / ASSESSMENT AND PLAN / ED COURSE  Pertinent labs results that were available during my care of the patient were reviewed by me and considered in my medical decision making (see chart for details).  Patient's diagnosis is consistent with strep pharyngitis. Patient will be discharged home with prescriptions for Magic mouthwash and amoxicillin to take as directed. Patient is to follow up with [facility] if symptoms persist past this treatment course. Patient is given ED precautions to return to the ED for any worsening or new symptoms.      ____________________________________________  FINAL CLINICAL IMPRESSION(S) / ED DIAGNOSES  Final diagnoses:  Strep pharyngitis      NEW MEDICATIONS STARTED DURING THIS VISIT:  New Prescriptions   AMOXICILLIN (AMOXIL)  875 MG TABLET    Take 1 tablet (875 mg total) by mouth 2 (two) times daily.   MAGIC MOUTHWASH W/LIDOCAINE SOLN    Take 5 mLs by mouth 4 (four) times daily as needed for mouth pain.         Braxton Feathers, PA-C 02/28/16 1141  Lavonia Drafts, MD 02/28/16 [phone]

## 2018-02-08 ENCOUNTER — Emergency Department
Admission: EM | Admit: 2018-02-08 | Discharge: 2018-02-08 | Disposition: A | Discharge status: Home / Self Care | Payer: BLUE CROSS/BLUE SHIELD | Attending: Emergency Medicine | Admitting: Emergency Medicine

## 2018-02-08 ENCOUNTER — Other Ambulatory Visit: Payer: Self-pay

## 2018-02-08 ENCOUNTER — Encounter: Payer: Self-pay | Admitting: Emergency Medicine

## 2018-02-08 DIAGNOSIS — J020 Streptococcal pharyngitis: Secondary | ICD-10-CM | POA: Insufficient documentation

## 2018-02-08 DIAGNOSIS — Z79899 Other long term (current) drug therapy: Secondary | ICD-10-CM | POA: Diagnosis not present

## 2018-02-08 DIAGNOSIS — J029 Acute pharyngitis, unspecified: Secondary | ICD-10-CM | POA: Diagnosis present

## 2018-02-08 LAB — INFLUENZA PANEL BY PCR (TYPE A & B)
INFLBPCR: NEGATIVE
Influenza A By PCR: NEGATIVE

## 2018-02-08 LAB — GROUP A STREP BY PCR: Group A Strep by PCR: DETECTED — AB

## 2018-02-08 MED ORDER — ACETAMINOPHEN 325 MG PO TABS
650.0000 mg | ORAL_TABLET | Freq: Once | ORAL | Status: AC
  Administered 2018-02-08: 650 mg via ORAL
  Filled 2018-02-08: qty 2

## 2018-02-08 MED ORDER — AMOXICILLIN 875 MG PO TABS: 875.0000 mg | ORAL_TABLET | Freq: Two times a day (BID) | ORAL | 0 refills | Status: DC

## 2018-02-08 NOTE — Discharge Instructions (Signed)
Follow-up with your regular doctor if not better in 3-5 days.  Gargle with warm salt water this is a natural antiseptic and will help soothe your throat.  Use the amoxicillin twice a day for 10 days.  Eat yogurt to prevent yeast.  Return to the emergency department if you are worsening  Discard your toothbrush in 3 days

## 2018-02-08 NOTE — ED Provider Notes (Signed)
[facility] Emergency Department Provider Note  ____________________________________________   First MD Initiated Contact with Patient 02/08/18 1920     (approximate)  I have reviewed the triage vital signs and the nursing notes.   HISTORY  Chief Complaint Cough and Generalized Body Aches    HPI Adrienne Clarke is a 32 y.o. female presents to the emergency department complaining of a fever that started this morning.  She took ibuprofen at 1 but it did not help.  She has body aches.  She has a sore throat.  And a mild cough.  She denies any vomiting or diarrhea  Past Medical History:  Diagnosis Date  . Obesity   . Vaginal Pap smear, abnormal     Patient Active Problem List   Diagnosis Date Noted  . GBS (group B Streptococcus carrier), +RV culture, currently pregnant 11/07/2015  . Status post vaginal delivery 11/07/2015  . NSVD (normal spontaneous vaginal delivery) 11/07/2015  . Term pregnancy delivered 11/06/2015    History reviewed. No pertinent surgical history.  Prior to Admission medications   Medication Sig Start Date End Date Taking? Authorizing Provider  amoxicillin (AMOXIL) 875 MG tablet Take 1 tablet (875 mg total) by mouth 2 (two) times daily. 02/08/18   Lashaun Poch, Linden Dolin, PA-C  ibuprofen (ADVIL,MOTRIN) 600 MG tablet Take 1 tablet (600 mg total) by mouth every 6 (six) hours. 11/07/15   Melancon, York Ram, MD  magic mouthwash w/lidocaine SOLN Take 5 mLs by mouth 4 (four) times daily as needed for mouth pain. 02/28/16   Hagler, Jami L, PA-C  Prenatal Vit-Fe Fumarate-FA (PRENATAL MULTIVITAMIN) TABS tablet Take 1 tablet by mouth daily.    [provider]    Allergies Patient has no known allergies.  No family history on file.  Social History Social History   Tobacco Use  . Smoking status: Never Smoker  Substance Use Topics  . Alcohol use: No  . Drug use: No    Review of Systems  Constitutional: Positive  fever/chills Eyes: No visual changes. ENT: Positive sore throat. Respiratory: Positive cough Genitourinary: Negative for dysuria. Musculoskeletal: Negative for back pain. Skin: Negative for rash.    ____________________________________________   PHYSICAL EXAM:  VITAL SIGNS: ED Triage Vitals  Enc Vitals Group     BP 02/08/18 1850 119/74     Pulse Rate 02/08/18 1850 (!) 120     Resp 02/08/18 1850 (!) 22     Temp 02/08/18 1850 (!) 101.1 F (38.4 C)     Temp Source 02/08/18 1850 Oral     SpO2 02/08/18 1850 98 %     Weight 02/08/18 1851 240 lb (108.9 kg)     Height 02/08/18 1851 5\' 5"  (1.651 m)     Head Circumference --      Peak Flow --      Pain Score 02/08/18 1902 10     Pain Loc --      Pain Edu? --      Excl. in [place]? --     Constitutional: Alert and oriented. Well appearing and in no acute distress.  Patient appears "glassy " Eyes: Conjunctivae are normal.  Head: Atraumatic. Nose: No congestion/rhinnorhea. Mouth/Throat: Mucous membranes are moist.  Throat is red swollen Neck : Supple, no lymphadenopathy is noted Cardiovascular: Normal rate, regular rhythm.  Heart sounds are normal Respiratory: Normal respiratory effort.  No retractions, lungs are clear to auscultation GU: deferred Musculoskeletal: FROM all extremities, warm and well perfused Neurologic:  Normal speech  and language.  Skin:  Skin is warm, dry and intact. No rash noted. Psychiatric: Mood and affect are normal. Speech and behavior are normal.  ____________________________________________   LABS (all labs ordered are listed, but only abnormal results are displayed)  Labs Reviewed  GROUP A STREP BY PCR - Abnormal; Notable for the following components:      Result Value   Group A Strep by PCR DETECTED (*)    All other components within normal limits  INFLUENZA PANEL BY PCR (TYPE A & B)    ____________________________________________   ____________________________________________  RADIOLOGY    ____________________________________________   PROCEDURES  Procedure(s) performed: No  Procedures    ____________________________________________   INITIAL IMPRESSION / ASSESSMENT AND PLAN / ED COURSE  Pertinent labs & imaging results that were available during my care of the patient were reviewed by me and considered in my medical decision making (see chart for details).  Patient is 32 year old female presents emergency department complaining of fever, chills, cough and sore throat.  On physical exam patient is febrile.  Her throat is red.  She has a dry cough  Flu and strep test are ordered    ----------------------------------------- 9:10 PM on 02/08/2018 -----------------------------------------  Flu test is negative, strep test is positive  Test results were discussed with the patient.  She was given a prescription for amoxicillin 875 mg twice daily for 10 days.  She is to eat yogurt.  She is to gargle with warm salt water.  She is to take Tylenol and ibuprofen as needed for fever.  If she is worsening she should see her regular doctor or return to the emergency department.  The patient states she understands will comply with instructions.  She was given a work note just in case she continues to run fever on Monday.  She was discharged in stable condition  As part of my medical decision making, I reviewed the following data within the [place] notes reviewed and incorporated, Labs reviewed flu negative, strep positive, Notes from prior ED visits and [place] Controlled Substance Database  ____________________________________________   FINAL CLINICAL IMPRESSION(S) / ED DIAGNOSES  Final diagnoses:  Acute streptococcal pharyngitis      NEW MEDICATIONS STARTED DURING THIS VISIT:  Discharge Medication List as of 02/08/2018  8:38 PM        Note:  This document was prepared using Dragon voice recognition software and may include unintentional dictation errors.    Versie Starks, PA-C 02/08/18 2111    Arta Silence, MD 02/08/18 [phone]

## 2018-02-08 NOTE — ED Triage Notes (Signed)
Cough and body aches began this am. Took ibuprofen approx 1 pm.

## 2018-06-12 LAB — HM PAP SMEAR

## 2019-01-09 ENCOUNTER — Telehealth: Payer: Self-pay | Admitting: *Deleted

## 2019-01-12 ENCOUNTER — Ambulatory Visit: Payer: BLUE CROSS/BLUE SHIELD

## 2019-01-12 ENCOUNTER — Ambulatory Visit: Payer: BLUE CROSS/BLUE SHIELD | Admitting: Obstetrics and Gynecology

## 2019-01-12 ENCOUNTER — Encounter: Payer: Self-pay | Admitting: Obstetrics and Gynecology

## 2019-01-12 ENCOUNTER — Other Ambulatory Visit (HOSPITAL_COMMUNITY)
Admission: RE | Admit: 2019-01-12 | Discharge: 2019-01-12 | Disposition: A | Discharge status: Home / Self Care | Payer: BLUE CROSS/BLUE SHIELD | Source: Ambulatory Visit | Attending: Obstetrics and Gynecology | Admitting: Obstetrics and Gynecology

## 2019-01-12 VITALS — BP 110/76 | HR 66 | Ht 65.5 in | Wt 270.0 lb

## 2019-01-12 DIAGNOSIS — N871 Moderate cervical dysplasia: Secondary | ICD-10-CM | POA: Insufficient documentation

## 2019-01-12 NOTE — Progress Notes (Signed)
Obstetrics & Gynecology Office Visit   Chief Complaint:  Chief Complaint  Patient presents with  . Colposcopy    Referred by [facility]    History of Present Illness:Adrienne Clarke is a 33 y.o. woman who presents today for continued surveillance for history of dysplasia. Last pap obtained on 6/20 HGSIL19 revealed.  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  Pap/Treatment History:  07/31/2018 LEEP   Anterior LEEP CIN 2-3 extending to endocervical margin, ectocervical margin free of dyplasia  Posterior LEEP CIN 2 0.1cm from the endocervical margin, extocervical margin free of dyplasia  Top Hat margins free of involvement, loose fragment of CIN 2  ECC negative 07/01/2018 Colposcopy 2 O'Clock CIN 2, 10 O'Clock CIN 2, ECC CIN 2-3 06/12/2018 Pap HGSIL  Review of Systems: Review of Systems  Constitutional: Negative.   Gastrointestinal: Negative.   Genitourinary: Negative.   Skin: Negative.      Past Medical History:  Past Medical History:  Diagnosis Date  . Obesity   . Vaginal Pap smear, abnormal     Past Surgical History:  Past Surgical History:  Procedure Laterality Date  . CERVICAL BIOPSY  W/ LOOP ELECTRODE EXCISION    . COLPOSCOPY      Gynecologic History: Patient's last menstrual period was 12/29/2018 (exact date).  Obstetric History: [id number]  Family History:  History reviewed. No pertinent family history.  Social History:  Social History   Socioeconomic History  . Marital status: Single    Spouse name: Not on file  . Number of children: Not on file  . Years of education: Not on file  . Highest education level: Not on file  Occupational History  . Not on file  Social Needs  . Financial resource strain: Not on file  . Food insecurity:    Worry: Not on file    Inability: Not on file  . Transportation needs:    Medical: Not on file    Non-medical: Not on file  Tobacco Use  . Smoking status: Never Smoker  . Smokeless tobacco: Never Used    Substance and Sexual Activity  . Alcohol use: No  . Drug use: No  . Sexual activity: Yes    Partners: Male    Birth control/protection: None  Lifestyle  . Physical activity:    Days per week: Not on file    Minutes per session: Not on file  . Stress: Not on file  Relationships  . Social connections:    Talks on phone: Not on file    Gets together: Not on file    Attends religious service: Not on file    Active member of club or organization: Not on file    Attends meetings of clubs or organizations: Not on file    Relationship status: Not on file  . Intimate partner violence:    Fear of current or ex partner: Not on file    Emotionally abused: Not on file    Physically abused: Not on file    Forced sexual activity: Not on file  Other Topics Concern  . Not on file  Social History Narrative  . Not on file    Allergies:  No Known Allergies  Medications: Prior to Admission medications   Not on File    Physical Exam Vitals:  Vitals:   01/12/19 1531  BP: 110/76  Pulse: 66   Patient's last menstrual period was 12/29/2018 (exact date).  General: NAD HEENT: normocephalic, anicteric Pulmonary: No increased  work of breathing Genitourinary:  External: Normal external female genitalia.  Normal urethral meatus, normal  Bartholin's and Skene's glands.    Vagina: Normal vaginal mucosa, no evidence of prolapse.    Cervix: Grossly normal in appearance, no bleeding  Uterus: Non-enlarged, mobile, normal contour.  No CMT  Adnexa: ovaries non-enlarged, no adnexal masses  Rectal: deferred  Lymphatic: no evidence of inguinal lymphadenopathy Extremities: no edema, erythema, or tenderness Neurologic: Grossly intact Psychiatric: Clarke appropriate, affect full  Female chaperone present for pelvic and breast  portions of the physical exam   GYNECOLOGY CLINIC COLPOSCOPY PROCEDURE NOTE  33 y.o. [id number] here for colposcopy for history CIN II.  Discussed underlying role for HPV  infection in the development of cervical dysplasia, its natural history and progression/regression, need for surveillance.  Is the patient  pregnant: No LMP: Patient's last menstrual period was 12/29/2018 (exact date). Smoking status:  reports that she has never smoked. She has never used smokeless tobacco.  Patient given informed consent, signed copy in the chart, time out was performed.  The patient was position in dorsal lithotomy position. Speculum was placed the cervix was visualized.   After application of acetic acid colposcopic inspection of the cervix was undertaken.   Colposcopy adequate, full visualization of transformation zone: Yes acetowhite lesion(s) noted faintly at 12 o'clock; corresponding biopsies obtained.   ECC specimen obtained:  Yes  All specimens were labeled and sent to pathology.   Patient was given post procedure instructions.  Will follow up pathology and manage accordingly.  Routine preventative health maintenance measures emphasized.    OBGyn Exam   Assessment: 33 y.o. [id number] follow up for CIN II  Plan: Problem List Items Addressed This Visit    None    Visit Diagnoses    CIN II (cervical intraepithelial neoplasia II)    -  Primary   Relevant Orders   Surgical pathology      - Pathology reviewed and negative margins so follow up pap in 12 months would be the preferred triage.  However, on biopsy contained a free floating segment of CIN II.  We discussed follow up pap in 12 months post LEEP vs colposcopy today with patient opting for repeat colposcopy today   - I had a lengthly discussion with Adrienne Clarke  regarding the cause of dysplasia of the lower genital tract (including immunosuppression in the setting of HPV exposure and tobacco exposure). I explained the potential for progression to invasive malignancy, the recurrent nature of these lesions (and the need for close continued followup). Results of today's pap will dictate need for further  evaluation and follow up per ASCCP guidelines..  - She is comfortable with the plan and had her questions answered.  - Return in about 6 months (around 07/13/2019) for repeat pap.   Adrienne Mood, MD, [facility], [place]

## 2019-01-16 ENCOUNTER — Telehealth: Payer: Self-pay | Admitting: Obstetrics and Gynecology

## 2019-01-16 NOTE — Telephone Encounter (Signed)
-----   Message from Malachy Mood, MD sent at 01/16/2019  8:53 AM EST ----- Regarding: LEEP Surgery Date:   LOS: in office  Surgery Booking Request Patient Full Name: LORAINE BHULLAR MRN: [id number]  DOB: 11/14/1986  Surgeon: Malachy Mood, MD  Requested Surgery Date and Time: 11min Primary Diagnosis and Code: CIN II Secondary Diagnosis and Code:  Surgical Procedure: LEEP L&D Notification:N/A Admission Status: In office Length of Surgery: N/A Special Case Needs: none H&P: day of (date) Phone Interview or Office Pre-Admit: N/A Interpreter: No Language: English Medical Clearance: No Special Scheduling Instructions:

## 2019-01-16 NOTE — Telephone Encounter (Signed)
Patient is aware of in-office LEEP on Tuesday, 01/27/19 @ 3:30pm w/ Dr Georgianne Fick. Patient confirmed [employer] and is aware of cost.

## 2019-01-16 NOTE — Progress Notes (Signed)
Sent you a leep scheduling form sometime in the next 1-4 weeks, in office

## 2019-01-16 NOTE — Telephone Encounter (Signed)
Patient is aware of [employer].

## 2019-01-16 NOTE — Telephone Encounter (Signed)
Per [facility], the patient has an active PPO, eff 12/17/16, no future term date. $4500 ded ($0 met), 80/20 coinsurance, $7000 fam oop ($0 met), and no auth required. Ref# [id number].

## 2019-01-19 NOTE — Telephone Encounter (Signed)
01/19/19 9:55am - Per Celina @ [place], the patient's policy is active as of 12/17/16 w/ no future term date. Ref# [id number].

## 2019-01-19 NOTE — Telephone Encounter (Signed)
Thanks for letting me know you are able to bill her insurance.  I am thinking it may lapse the end of January, because she went from full time to part time and lost benefits per patient.

## 2019-01-22 NOTE — Telephone Encounter (Signed)
Patient rescheduled LEEP to Wed, 02/04/19 @ 3:30p.m

## 2019-01-27 ENCOUNTER — Ambulatory Visit: Payer: BLUE CROSS/BLUE SHIELD | Admitting: Obstetrics and Gynecology

## 2019-02-03 NOTE — Telephone Encounter (Signed)
02/03/19 10:51am - Per Debbie @ [place], the patient's policy is active as of 12/17/16 w/ no future term date. Ref# [id number].

## 2019-02-04 ENCOUNTER — Encounter: Payer: Self-pay | Admitting: Obstetrics and Gynecology

## 2019-02-04 ENCOUNTER — Ambulatory Visit (INDEPENDENT_AMBULATORY_CARE_PROVIDER_SITE_OTHER): Payer: BLUE CROSS/BLUE SHIELD | Admitting: Obstetrics and Gynecology

## 2019-02-04 ENCOUNTER — Other Ambulatory Visit (HOSPITAL_COMMUNITY)
Admission: RE | Admit: 2019-02-04 | Discharge: 2019-02-04 | Disposition: A | Discharge status: Home / Self Care | Payer: BLUE CROSS/BLUE SHIELD | Source: Ambulatory Visit | Attending: Obstetrics and Gynecology | Admitting: Obstetrics and Gynecology

## 2019-02-04 VITALS — BP 110/68 | HR 87 | Ht 65.5 in | Wt 268.0 lb

## 2019-02-04 DIAGNOSIS — N871 Moderate cervical dysplasia: Secondary | ICD-10-CM

## 2019-02-04 NOTE — Progress Notes (Signed)
   LEEP PROCEDURE NOTE  The LEEP has been explained to the patient in detail; risks/benefits reviewed.  The risks include, but are not limited to, bleeding, infection, and the possibility of cervical stenosis or cervical incompetence.  The patient had previously been given information regarding abnormal PAP smears and their relationship to HPV.  We have discussed the natural course and history of HPV, the possibility of incomplete treatment by the LEEP, as well as the possibility of recurrence.  I have reviewed the consent form for LEEP with her, and she fully understands its contents.  We have discussed the procedure itself. I have informed her that following the LEEP she should refrain from intercourse and the use of tampons for three weeks, and that she should also expect some spotting and brown/black discharge over the next several days.  We have discussed the fact that vaginal bleeding, differentiated from spotting, is not normal and that if she should have this complication, she should contact me immediately.  The follow-up after LEEP will be PAP smears or viral typing performed at regular intervals for up to 3-5 years.  Should these all prove to be normal, she will then be back on typical cervical screening.  I have answered all of her questions, and I believe she has an adequate understanding of the LEEP, its implications, and the necessity of follow-up care.  I discussed her colpo results and explained the procedure of LEEP.  All questions were answered and she signed the consent form.    LEEP performed in the usual manner after reviewing the previous colpo findings and results. Acetic acid solution was usesd to identify any abnormal areas of the cervix.  The cervix was cleansed with betadine solution. Local injection of 1%  Lidocaine with epinephrine 61mL was performed for anesthesia. Ectocervical and then endocervical specimens obtained using the loop electrodes without difficulty.  It was  labeled accordingly. ECC was obtained for top margin.  The base and edges of the defect was then cauterized using coagulation current.  Malachy Mood, MD 02/04/2019 4:17 PM

## 2019-03-16 ENCOUNTER — Encounter: Payer: Self-pay | Admitting: Obstetrics and Gynecology

## 2019-03-16 ENCOUNTER — Ambulatory Visit (INDEPENDENT_AMBULATORY_CARE_PROVIDER_SITE_OTHER): Payer: BLUE CROSS/BLUE SHIELD | Admitting: Obstetrics and Gynecology

## 2019-03-16 ENCOUNTER — Other Ambulatory Visit: Payer: Self-pay

## 2019-03-16 VITALS — BP 104/62 | HR 93 | Wt 280.0 lb

## 2019-03-16 DIAGNOSIS — Z4889 Encounter for other specified surgical aftercare: Secondary | ICD-10-CM

## 2019-03-16 NOTE — Progress Notes (Signed)
      Postoperative Follow-up Patient presents post op from LEEP 6weeks ago for cervical dysplasia.  Subjective: Patient reports marked improvement in her preop symptoms. Eating a regular diet without difficulty. The patient is not having any pain.  Activity: normal activities of daily living.  Objective: Blood pressure 104/62, pulse 93, weight 280 lb (127 kg), last menstrual period 03/10/2019.  General: NAD Pulmonary: no increased work of breathing Abdomen: soft, non-tender, non-distended, incision(s) D/C/I GU: normal external female genitalia cervix well healed Extremities: no edema Neurologic: normal gait    Admission on 02/08/2018, Discharged on 02/08/2018  Component Date Value Ref Range Status  . Influenza A By PCR 02/08/2018 NEGATIVE  NEGATIVE Final  . Influenza B By PCR 02/08/2018 NEGATIVE  NEGATIVE Final   Comment: (NOTE) The Xpert Xpress Flu assay is intended as an aid in the diagnosis of  influenza and should not be used as a sole basis for treatment.  This  assay is FDA approved for nasopharyngeal swab specimens only. Nasal  washings and aspirates are unacceptable for Xpert Xpress Flu testing. Performed at [facility], [address], [place], [place] [address]   . Group A Strep by PCR 02/08/2018 DETECTED* NOT DETECTED Final   Performed at [facility], [address] [place], [place] [address]      Assessment: 33 y.o. s/p LEEP stable  Plan: Patient has done well after surgery with no apparent complications.  I have discussed the post-operative course to date, and the expected progress moving forward.  The patient understands what complications to be concerned about.  I will see the patient in routine follow up, or sooner if needed.    Activity plan: No restriction.   Malachy Mood, MD, [facility], [facility] 03/16/2019, 4:14 PM

## 2019-05-04 ENCOUNTER — Other Ambulatory Visit: Payer: BLUE CROSS/BLUE SHIELD

## 2019-05-04 ENCOUNTER — Telehealth: Payer: Self-pay | Admitting: *Deleted

## 2019-05-04 DIAGNOSIS — Z20822 Contact with and (suspected) exposure to covid-19: Secondary | ICD-10-CM

## 2019-05-04 NOTE — Telephone Encounter (Signed)
Contacted pt to schedule testing; pt offered and accepted appointment at [facility] site 05/04/2019 at 1245; pt also given directions and instructed to wear a mask to the site; she verbalized understanding; test ordered per protocol.

## 2019-05-04 NOTE — Telephone Encounter (Signed)
Contacted Danice Goltz, RN at [facility] Dept; Thayer Headings states that she has screened the pt and has determined that testing is needed; she says that the pt has severe obesity,  SOB, fever, cough, chest pressure, and works at an [employer]; the pt does not have a PCP; will attempt to contact pt.

## 2019-05-07 LAB — NOVEL CORONAVIRUS, NAA: SARS-CoV-2, NAA: NOT DETECTED

## 2019-07-13 ENCOUNTER — Ambulatory Visit (LOCAL_COMMUNITY_HEALTH_CENTER): Payer: BC Managed Care – PPO | Admitting: Nurse Practitioner

## 2019-07-13 ENCOUNTER — Other Ambulatory Visit: Payer: Self-pay

## 2019-07-13 VITALS — BP 115/74 | Ht 65.0 in | Wt 279.0 lb

## 2019-07-13 DIAGNOSIS — Z01419 Encounter for gynecological examination (general) (routine) without abnormal findings: Secondary | ICD-10-CM

## 2019-07-13 DIAGNOSIS — B9689 Other specified bacterial agents as the cause of diseases classified elsewhere: Secondary | ICD-10-CM

## 2019-07-13 DIAGNOSIS — Z6841 Body Mass Index (BMI) 40.0 and over, adult: Secondary | ICD-10-CM

## 2019-07-13 DIAGNOSIS — Z3009 Encounter for other general counseling and advice on contraception: Secondary | ICD-10-CM

## 2019-07-13 DIAGNOSIS — N760 Acute vaginitis: Secondary | ICD-10-CM

## 2019-07-13 LAB — WET PREP FOR TRICH, YEAST, CLUE
Trichomonas Exam: NEGATIVE
Yeast Exam: NEGATIVE

## 2019-07-13 MED ORDER — METRONIDAZOLE 500 MG PO TABS: 500.0000 mg | ORAL_TABLET | Freq: Two times a day (BID) | ORAL | 0 refills | Status: DC

## 2019-07-13 NOTE — Progress Notes (Signed)
   [place] problem visit  [place] Department  Subjective:  Adrienne Clarke is a 33 y.o. being seen today for   Chief Complaint  Patient presents with  . 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 Priscille Loveless at [facility] to taking [facility] OCP's for [facility] previously  - stopped after CIN2 diagnosis ~ 11/2018  Declines any additional BCM at this time.      Does the patient have a current or past history of drug use? No   No components found for: HCV]   Health Maintenance Due  Topic Date Due  . TETANUS/TDAP  10/26/2007    Review of Systems  Constitutional: Negative.   Respiratory: Negative.   Cardiovascular: Negative.   Skin: Negative.     The following portions of the patient's history were reviewed and updated as appropriate: allergies, current medications, past family history, past medical history, past social history, past surgical history and problem list. Problem list updated.   See flowsheet for other program required questions.  Objective:   Vitals:   07/13/19 1340  BP: 115/74  Weight: 279 lb (126.6 kg)  Height: 5\' 5"  (1.651 m)    Physical Exam Constitutional:      Appearance: She is obese.  Neck:     Musculoskeletal: Full passive range of motion without pain, normal range of motion and neck supple.     Thyroid: No thyroid mass.  Genitourinary:    Comments: Ph = 4.5 No foul odor - denies itching  Lymphadenopathy:     Cervical: No cervical adenopathy.     Right cervical: No superficial cervical adenopathy.    Left cervical: No superficial cervical adenopathy.  Neurological:     Mental Status: She is alert  and oriented to person, place, and time.  Psychiatric:        Behavior: Behavior is cooperative.       Assessment and Plan:  Adrienne Clarke is a 33 y.o. female presenting to the [facility] Department for a Women's Health problem visit  1. Visit for pelvic exam Await wetmount and GC/Chlamydia results Provider to review wet mount results  2. Family planning services Offer ECP  3. Family planning services Advise client to restart OCP's if bleeding begins and last longer than original 5 days. Follow up in 01/2020 with Dr. Priscille Loveless for repeat pap  4. Bacterial vaginosis Please treat for BV per standing order.  Client verbalizes understanding and is in agreement with plan of care     Return in about 5 months (around 12/19/2019) for physical or PRN.  No future appointments.  Berniece Andreas, NP

## 2019-07-13 NOTE — Progress Notes (Signed)
Pt states period came on 06/24/2019 and stayed on for 2 weeks, 1 day had so much pain could not do anything, just stopped bleeding 07/10/2019. Last sex 07/11/2019, started spotting again. Last physical with [facility] was 12/16/2018.

## 2019-07-13 NOTE — Progress Notes (Signed)
Wet mount reviewed by provider; per verbal order by K.Leath, FNP, pt treated for BV per standing order. Pt declined ECP, declined condoms and all other forms of birth control. Pt knows she can RTC if she changes her mind about birth control. Provider orders completed.

## 2019-07-14 DIAGNOSIS — Z6841 Body Mass Index (BMI) 40.0 and over, adult: Secondary | ICD-10-CM | POA: Insufficient documentation

## 2019-07-16 ENCOUNTER — Telehealth: Payer: Self-pay | Admitting: Family Medicine

## 2019-07-16 NOTE — Telephone Encounter (Signed)
TC to patient who states she was given medicine for BV on 07/13/19 and took 2 doses that day. She states she woke up with a swollen lower lip that night before work, and then started having "hives on lower lip skin". She states her lip is very painful and that makes it hard to eat. Per consult with Criss Rosales, patient counseled to stop the Metronidazole and take ibuprofen and Claritin to reduce pain and swelling, and to come to clinic to be given alternative medication for BV, and see provider. Patient states she will come at 10:00am tomorrow, 07/17/19.Marland KitchenJenetta Downer, RN

## 2019-07-17 ENCOUNTER — Encounter: Payer: Self-pay | Admitting: Physician Assistant

## 2019-07-17 ENCOUNTER — Ambulatory Visit (LOCAL_COMMUNITY_HEALTH_CENTER): Payer: BC Managed Care – PPO | Admitting: Physician Assistant

## 2019-07-17 DIAGNOSIS — T7840XA Allergy, unspecified, initial encounter: Secondary | ICD-10-CM

## 2019-07-17 DIAGNOSIS — Z09 Encounter for follow-up examination after completed treatment for conditions other than malignant neoplasm: Secondary | ICD-10-CM

## 2019-07-17 DIAGNOSIS — Z3009 Encounter for other general counseling and advice on contraception: Secondary | ICD-10-CM

## 2019-07-17 MED ORDER — CLINDAMYCIN PHOSPHATE 2 % VA CREA
1.0000 | TOPICAL_CREAM | Freq: Every day | VAGINAL | 0 refills | Status: DC
Start: 2019-07-17 — End: 2020-02-09

## 2019-07-17 MED ORDER — CLOTRIMAZOLE 2 % VA CREA: 1.0000 | TOPICAL_CREAM | Freq: Every day | VAGINAL | 0 refills | Status: DC

## 2019-07-17 NOTE — Progress Notes (Signed)
Adrienne Clarke problem visit  [place] Department  Subjective:  Adrienne Clarke is a 33 y.o. being seen today for a follow up visit from her last visit on 07/13/2019.  Chief Complaint  Patient presents with  . Follow-up    allergic reaction to medicine for BV    HPI  Patient reports that she has taken the pills for BV in the past without any problems.  States that she took 2 doses of Metronidazole yesterday and woke up after taking the second dose with a swollen, itchy, painful lower lip with a rash.  Denies any changes to diet or exposure to new make up or other products.  States that she followed the directions given by the RN that she spoke to yesterday and her symptoms have not worsened but her lip is still swollen and slightly painful.  Reports that she has not taken any more of the Metronidazole.  Requests medicine to complete her treatment for BV and states that any time she takes an antibiotic, she gets yeast so she usually eats yogurt to prevent yeast infection.     Does the patient have a current or past history of drug use? No   No components found for: HCV]   Health Maintenance Due  Topic Date Due  . TETANUS/TDAP  10/26/2007    Review of Systems  All other systems reviewed and are negative.   The following portions of the patient's history were reviewed and updated as appropriate: allergies, current medications, past family history, past medical history, past social history, past surgical history and problem list. Problem list updated.   See flowsheet for other program required questions.  Objective:  There were no vitals filed for this visit.  Physical Exam Constitutional:      General: She is not in acute distress.    Appearance: Normal appearance.  HENT:     Head: Normocephalic and atraumatic.     Mouth/Throat:     Pharynx: Oropharynx is clear. No oropharyngeal exudate or posterior oropharyngeal erythema.     Comments: Lower lip  with moderate swelling, mild erythema, no rash or lesions present today.  Small amount of dry, peeling skin on lip.  Pulmonary:     Effort: Pulmonary effort is normal.  Neurological:     Mental Status: She is alert and oriented to person, place, and time.  Psychiatric:        Mood and Affect: Mood normal.        Behavior: Behavior normal.        Thought Content: Thought content normal.        Judgment: Judgment normal.       Assessment and Plan:  Adrienne Clarke is a 33 y.o. female presenting to the [facility] Department for a Women's Health problem visit  1. Follow-up exam Counseled patient to dispose of Metronidazole. Will give Cleocin 2% vaginal cream 1 app per vagina qhs for 7 days to complete treatment for BV Will give Clotrimazole 1% vaginal cream 1 app per vagina qhs for 7 days after completing Cleocin. No sex for 14 days Rec condoms with all sex RTC prn  2. Allergic reaction to drug, initial encounter Likely allergic reaction to Metronidazole,. Counseled patient that she should from now forward let any provider know so she does not have a worse reaction in future. Will add to allergy list in chart Rec that patient use Vaseline on lip to keep moisturized and to continue with  OTC antihistamine, preferable non-sedating one, and OTC analgesic as needed for discomfort. Rec try cool/cold compress to lip to decrease swelling, itching and pain.  Follow up with PCP if symptoms worsen.      No follow-ups on file.  No future appointments.  Jerene Dilling, PA

## 2019-07-20 ENCOUNTER — Ambulatory Visit (INDEPENDENT_AMBULATORY_CARE_PROVIDER_SITE_OTHER)
Admission: RE | Admit: 2019-07-20 | Discharge: 2019-07-20 | Disposition: A | Discharge status: Home / Self Care | Payer: BLUE CROSS/BLUE SHIELD | Source: Ambulatory Visit

## 2019-07-20 DIAGNOSIS — R350 Frequency of micturition: Secondary | ICD-10-CM | POA: Diagnosis not present

## 2019-07-20 MED ORDER — NITROFURANTOIN MONOHYD MACRO 100 MG PO CAPS: 100.0000 mg | ORAL_CAPSULE | Freq: Two times a day (BID) | ORAL | 0 refills | Status: AC

## 2019-07-20 NOTE — Discharge Instructions (Signed)
Complete course of antibiotics.  Drink plenty of water to empty bladder regularly. Avoid alcohol and caffeine as these may irritate the bladder.   If no improvement or any worsening of symptoms please be seen in person.

## 2019-07-20 NOTE — ED Provider Notes (Signed)
Virtual Visit via Video Note:  Adrienne Clarke  initiated request for Telemedicine visit with [facility] team. I connected with Adrienne Clarke  on 07/20/2019 at 12:46 PM  for a synchronized telemedicine visit using a video enabled HIPPA compliant telemedicine application. I verified that I am speaking with Adrienne Clarke  using two identifiers. Zigmund Gottron, NP  was physically located in a [facility]gent care site and Adrienne Clarke was located at a different location.   The limitations of evaluation and management by telemedicine as well as the availability of in-person appointments were discussed. Patient was informed that she  may incur a bill ( including co-pay) for this virtual visit encounter. Adrienne Clarke  expressed understanding and gave verbal consent to proceed with virtual visit.     History of Present Illness:Adrienne Clarke  is a 33 y.o. female presents with complaints of symptoms of UTI for the past 12 days. Difficulty in emptying bladder completely. Some low back pain. Pelvic pressure. Urinary frequency. No burning with urination, feels like she has "push" hard to empty bladder. Has had similar in the past. Similar approximately in march. Some vaginal itching, notices it after she urinates. 06/24/19 LMP. Took medication last week for BV, completed it. No fevers. Nausea occasionally, no diarrhea or constipation. No upper/mid back pain. Without contributing medical history.      Past Medical History:  Diagnosis Date  . Obesity   . Vaginal Pap smear, abnormal     Allergies  Allergen Reactions  . Metronidazole Swelling and Rash        Observations/Objective: Alert, oriented, non toxic in appearance. Clear coherent speech without difficulty. No increased work of breathing visualized.    Assessment and Plan: macrobid provided. In person return precautions discussed. Patient verbalized understanding and agreeable to plan.    Follow Up Instructions:    I  discussed the assessment and treatment plan with the patient. The patient was provided an opportunity to ask questions and all were answered. The patient agreed with the plan and demonstrated an understanding of the instructions.   The patient was advised to call back or seek an in-person evaluation if the symptoms worsen or if the condition fails to improve as anticipated.  I provided 13 minutes of non-face-to-face time during this encounter.    Zigmund Gottron, NP  07/20/2019 12:46 PM         Zigmund Gottron, NP 07/20/19 1246

## 2019-09-24 ENCOUNTER — Other Ambulatory Visit: Payer: Self-pay

## 2019-09-24 DIAGNOSIS — Z20822 Contact with and (suspected) exposure to covid-19: Secondary | ICD-10-CM

## 2019-09-25 LAB — NOVEL CORONAVIRUS, NAA: SARS-CoV-2, NAA: NOT DETECTED

## 2019-10-14 ENCOUNTER — Other Ambulatory Visit: Payer: Self-pay | Admitting: *Deleted

## 2019-10-14 DIAGNOSIS — Z20822 Contact with and (suspected) exposure to covid-19: Secondary | ICD-10-CM

## 2019-10-15 LAB — NOVEL CORONAVIRUS, NAA: SARS-CoV-2, NAA: NOT DETECTED

## 2019-12-18 NOTE — L&D Delivery Note (Signed)
Vaginal Delivery Note  Spontaneous delivery of live viable female infant from the ROA position through an intact perineum. Delivery of anterior left shoulder with gentle downward guidance followed by delivery of the right posterior shoulder with gentle upward guidance. Body followed spontaneously. Infant placed on maternal chest. Nursery present and helped with neonatal resuscitation and evaluation. Cord clamped and cut after one minute. Cord blood collected. Placenta delivered spontaneously and intact with a 3 vessel cord.  No lacerations. Uterus firm and below umbilicus at the end of the delivery.  Mom and baby recovering in stable condition. Sponge and needle counts were correct at the end of the delivery.  APGARS: 1 minute:10  5 minutes: 10 Weight: pending Epidural in place  [place], [facility] 09/30/20 8:34 AM

## 2020-02-09 ENCOUNTER — Ambulatory Visit (INDEPENDENT_AMBULATORY_CARE_PROVIDER_SITE_OTHER): Payer: Self-pay | Admitting: Certified Nurse Midwife

## 2020-02-09 ENCOUNTER — Other Ambulatory Visit (HOSPITAL_COMMUNITY)
Admission: RE | Admit: 2020-02-09 | Discharge: 2020-02-09 | Disposition: A | Discharge status: Home / Self Care | Payer: Medicaid Other | Source: Ambulatory Visit | Attending: Certified Nurse Midwife | Admitting: Certified Nurse Midwife

## 2020-02-09 ENCOUNTER — Encounter: Payer: Self-pay | Admitting: Certified Nurse Midwife

## 2020-02-09 ENCOUNTER — Other Ambulatory Visit: Payer: Self-pay

## 2020-02-09 VITALS — BP 114/84 | Ht 65.0 in | Wt 282.0 lb

## 2020-02-09 DIAGNOSIS — Z113 Encounter for screening for infections with a predominantly sexual mode of transmission: Secondary | ICD-10-CM | POA: Diagnosis present

## 2020-02-09 DIAGNOSIS — Z3A01 Less than 8 weeks gestation of pregnancy: Secondary | ICD-10-CM | POA: Insufficient documentation

## 2020-02-09 DIAGNOSIS — Z9889 Other specified postprocedural states: Secondary | ICD-10-CM

## 2020-02-09 DIAGNOSIS — O99211 Obesity complicating pregnancy, first trimester: Secondary | ICD-10-CM

## 2020-02-09 DIAGNOSIS — O0990 Supervision of high risk pregnancy, unspecified, unspecified trimester: Secondary | ICD-10-CM | POA: Diagnosis present

## 2020-02-09 DIAGNOSIS — N912 Amenorrhea, unspecified: Secondary | ICD-10-CM

## 2020-02-09 DIAGNOSIS — O3440 Maternal care for other abnormalities of cervix, unspecified trimester: Secondary | ICD-10-CM

## 2020-02-09 DIAGNOSIS — O09891 Supervision of other high risk pregnancies, first trimester: Secondary | ICD-10-CM

## 2020-02-09 DIAGNOSIS — Z6841 Body Mass Index (BMI) 40.0 and over, adult: Secondary | ICD-10-CM

## 2020-02-09 DIAGNOSIS — Z87410 Personal history of cervical dysplasia: Secondary | ICD-10-CM

## 2020-02-09 DIAGNOSIS — Z131 Encounter for screening for diabetes mellitus: Secondary | ICD-10-CM

## 2020-02-09 DIAGNOSIS — O3441 Maternal care for other abnormalities of cervix, first trimester: Secondary | ICD-10-CM

## 2020-02-09 LAB — POCT URINALYSIS DIPSTICK OB
Glucose, UA: NEGATIVE
POC,PROTEIN,UA: NEGATIVE

## 2020-02-09 MED ORDER — FOLIC ACID 1 MG PO TABS: 1.0000 mg | ORAL_TABLET | Freq: Every day | ORAL | 10 refills | Status: AC

## 2020-02-09 NOTE — Progress Notes (Signed)
New Obstetric Patient H&P    Chief Complaint: "Desires prenatal care"   History of Present Illness: Adrienne Clarke is a 34 y.o. G44P3003 Black female, LMP 1/11/2021who presents with amenorrhea and positive home pregnancy test. Based on her LMP, her EDD is Estimated Date of Delivery: 10/03/20 and her EGA is 108w1d. Cycles are 4. days, regular, and occur approximately every : 28 days. She had a urine pregnancy test which was positive 02/03/2020  Her last menstrual period was normal and lasted for  4 day(s). She was not using any contraception prior to her +UPT. Since her LMP she claims she has experienced cramping, nausea, fatigue and breast tenderness. She denies vaginal bleeding. Her past medical history is remarkable for obesity (current BMI 46.93 kg/m2) and an abnormal Pap smear. 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 Second LEEP on 02/04/2019 was significant for focal CIN with free margins. .  Her prior pregnancies are notable for three normal term vaginal deliveries. Pelvis proven to 9#.  Since her LMP, she admits to the use of tobacco products  no She claims she has gained 7 pounds since the start of her pregnancy.  There are cats in the home in the home  no  She admits close contact with children on a regular basis  yes  She has had chicken pox in the past yes She has had Tuberculosis exposures, symptoms, or previously tested positive for TB   no Current or past history of domestic violence. no  Genetic Screening/Teratology Counseling: (Includes patient, baby's father, or anyone in either family with:)   59. Patient's age >/= 50 at [facility]  no 2. Thalassemia ([place], [place], [place], or Asian background): MCV<80  no 3. Neural tube defect (meningomyelocele, spina bifida, anencephaly)  no 4. Congenital heart defect  no  5. Down syndrome  no 6. Tay-Sachs (Jewish, [place])  no 7. Canavan's Disease  no 8. Sickle cell disease or trait (African)  no  9. Hemophilia or other blood disorders  no  10. Muscular dystrophy  no  11. Cystic fibrosis  no  12. Huntington's Chorea  no  13. Mental retardation/autism  no 14. Other inherited genetic or chromosomal disorder  no 15. Maternal metabolic disorder (DM, PKU, etc)  no 16. Patient or FOB with a child with a birth defect not listed above no  16a. Patient or FOB with a birth defect themselves no 17. Recurrent pregnancy loss, or stillbirth  no  18. Any medications since LMP other than prenatal vitamins (include vitamins, supplements, OTC meds, drugs, alcohol)  no 19. Any other genetic/environmental exposure to discuss  no  Infection History:   1. Lives with someone with TB or TB exposed  no  2. Patient or partner has history of genital herpes  no 3. Rash or viral illness since LMP  no 4. History of STI (GC, CT, HPV, syphilis, HIV)  no 5. History of recent travel :  no  Other pertinent information:  Yes, FOB is [place], age 77 who is the father of their youngest child.    Review of Systems:10 point review of systems negative unless otherwise noted in HPI  Past Medical History:  Past Medical History:  Diagnosis Date  . Cervical dysplasia   . Obesity   . Vaginal Pap smear, abnormal     Past Surgical History:  Past Surgical History:  Procedure Laterality Date  . 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 COLPOSCOPY      Gynecologic History: Patient's last menstrual period was 12/28/2019 (approximate).  Obstetric History: [id number]  Family History:  Family History  Problem Relation Age of Onset  . AAA (abdominal aortic aneurysm) Maternal Grandfather     Social History:  Social History   Socioeconomic History  . Marital status: Significant Other    Spouse name: Kenyatta  . Number of children: 3  . Years of education: 12+  . Highest  education level: Not on file  Occupational History  . Not on file  Tobacco Use  . Smoking status: Never Smoker  . Smokeless tobacco: Never Used  Substance and Sexual Activity  . Alcohol use: No  . Drug use: No  . Sexual activity: Yes    Partners: Male    Birth control/protection: None  Other Topics Concern  . Not on file  Social History Narrative  . Not on file   Social Determinants of Health   Financial Resource Strain:   . Difficulty of Paying Living Expenses: Not on file  Food Insecurity:   . Worried About Charity fundraiser in the Last Year: Not on file  . Ran Out of Food in the Last Year: Not on file  Transportation Needs:   . Lack of Transportation (Medical): Not on file  . Lack of Transportation (Non-Medical): Not on file  Physical Activity:   . Days of Exercise per Week: Not on file  . Minutes of Exercise per Session: Not on file  Stress:   . Feeling of Stress : Not on file  Social Connections:   . Frequency of Communication with Friends and Family: Not on file  . Frequency of Social Gatherings with Friends and Family: Not on file  . Attends Religious Services: Not on file  . Active Member of Clubs or Organizations: Not on file  . Attends Archivist Meetings: Not on file  . Marital Status: Not on file  Intimate Partner Violence:   . Fear of Current or Ex-Partner: Not on file  . Emotionally Abused: Not on file  . Physically Abused: Not on file  . Sexually Abused: Not on file    Allergies:  No Known Allergies  Medications: Prior to Admission medications   Medication Sig Start Date End Date Taking? Authorizing Provider  Multiple Vitamins-Minerals (MULTIVITAMIN WOMEN) TABS Take 1 tablet by mouth 1 day or 1 dose.   Yes [provider]           Physical Exam Vitals: BP 114/84   Wt 282 lb (127.9 kg)   LMP 12/28/2019 (Approximate) Comment: abnormal  BMI 46.93 kg/m   General: BF in NAD HEENT: normocephalic, anicteric Thyroid: no  enlargement, no palpable nodules Pulmonary: No increased work of breathing, CTAB Cardiovascular: RRR, without murmur Breasts: soft, no masses, no nipple or skin changes. No axillary, supraclavicular or infraclavicular LAN. Abdomen: soft, non-tender, non-distended.  Umbilicus without lesions.  No hepatomegaly or masses palpable. No evidence of hernia  Genitourinary:  External: Normal external female genitalia.  Normal urethral meatus, normal  Bartholin's and Skene's glands.    Vagina: Normal vaginal mucosa, no evidence of prolapse.    Cervix: Grossly normal in appearance, not friable, NT  Uterus: AV, Non-enlarged, mobile, normal contour.   Adnexa: ovaries non-enlarged, no adnexal masses  Rectal: deferred Extremities: no edema, erythema, or tenderness Neurologic: Grossly  intact Psychiatric: mood appropriate, affect full   Assessment: 34 y.o. G4P3003 at [redacted]w[redacted]d presenting to initiate prenatal care S/P LEEP x 2 BMI 46-47 kg/m2   Plan: 1) Avoid alcoholic beverages.Marland Kitchen  2) Take prenatal vitamins daily.  3) Nutrition, food safety (fish, cheese advisories, and high nitrite foods) and exercise discussed. 4) Hospital and practice style discussed with cross coverage system.  5) Genetic Screening, such as with 1st Trimester Screening, cell free fetal DNA, AFP testing, and Ultrasound,  is discussed with patient. At the conclusion of today's visit patient requested genetic testing 6) Patient is asked about travel to areas at risk for the Zika virus, and counseled to avoid travel and exposure to mosquitoes or sexual partners who may have themselves been exposed to the virus. Testing is discussed, and will be ordered as appropriate.  7) Discussed risks that can be associated with LEEP, specifically incompetent cervix and cervical stenosis. Plan on getting cervical length at 16 weeks. 8) Discussed risks of obesity and recommended keeping weight gain to 10-15#. Start folic acid 1 mgm daily. Will need  anesthesiology consult at 24 weeks. Growth scans in the last trimester, APTing at 36 weeks. Start baby ASA after 12 weeks.  9) NOB labs today. Pap repeated today 10) RTO in 2 weeks for 1 hr GTT and dating/viability scan.  Dalia Heading, CNM     No concerns. rj

## 2020-02-10 ENCOUNTER — Encounter: Payer: Self-pay | Admitting: Certified Nurse Midwife

## 2020-02-10 DIAGNOSIS — Z87410 Personal history of cervical dysplasia: Secondary | ICD-10-CM | POA: Insufficient documentation

## 2020-02-10 DIAGNOSIS — Z9889 Other specified postprocedural states: Secondary | ICD-10-CM | POA: Insufficient documentation

## 2020-02-10 DIAGNOSIS — O0990 Supervision of high risk pregnancy, unspecified, unspecified trimester: Secondary | ICD-10-CM | POA: Insufficient documentation

## 2020-02-10 DIAGNOSIS — O3440 Maternal care for other abnormalities of cervix, unspecified trimester: Secondary | ICD-10-CM | POA: Insufficient documentation

## 2020-02-10 LAB — PROTEIN / CREATININE RATIO, URINE
Creatinine, Urine: 174.5 mg/dL
Protein, Ur: 11.6 mg/dL
Protein/Creat Ratio: 66 mg/g creat (ref 0–200)

## 2020-02-11 LAB — CMP14+EGFR
ALT: 13 IU/L (ref 0–32)
AST: 13 IU/L (ref 0–40)
Albumin/Globulin Ratio: 1.4 (ref 1.2–2.2)
Albumin: 3.9 g/dL (ref 3.8–4.8)
Alkaline Phosphatase: 60 IU/L (ref 39–117)
BUN/Creatinine Ratio: 12 (ref 9–23)
BUN: 10 mg/dL (ref 6–20)
Bilirubin Total: <0.2 mg/dL (ref 0.0–1.2)
CO2: 20 mmol/L (ref 20–29)
Calcium: 8.8 mg/dL (ref 8.7–10.2)
Chloride: 104 mmol/L (ref 96–106)
Creatinine, Ser: 0.81 mg/dL (ref 0.57–1.00)
GFR calc Af Amer: 112 mL/min/{1.73_m2} (ref >59)
GFR calc non Af Amer: 97 mL/min/{1.73_m2} (ref >59)
Globulin, Total: 2.8 g/dL (ref 1.5–4.5)
Glucose: 87 mg/dL (ref 65–99)
Potassium: 4.3 mmol/L (ref 3.5–5.2)
Sodium: 137 mmol/L (ref 134–144)
Total Protein: 6.7 g/dL (ref 6.0–8.5)

## 2020-02-11 LAB — HEMOGLOBINOPATHY EVALUATION
HGB C: 0 %
HGB S: 0 %
HGB VARIANT: 0 %
Hemoglobin A2 Quantitation: 2 % (ref 1.8–3.2)
Hemoglobin F Quantitation: 0 % (ref 0.0–2.0)
Hgb A: 98 % (ref 96.4–98.8)

## 2020-02-11 LAB — RPR+RH+ABO+RUB AB+AB SCR+CB...
Antibody Screen: NEGATIVE
HIV Screen 4th Generation wRfx: NONREACTIVE
Hematocrit: 36.6 % (ref 34.0–46.6)
Hemoglobin: 12 g/dL (ref 11.1–15.9)
Hepatitis B Surface Ag: NEGATIVE
MCH: 27.8 pg (ref 26.6–33.0)
MCHC: 32.8 g/dL (ref 31.5–35.7)
MCV: 85 fL (ref 79–97)
Platelets: 203 10*3/uL (ref 150–450)
RBC: 4.31 x10E6/uL (ref 3.77–5.28)
RDW: 12.7 % (ref 11.7–15.4)
RPR Ser Ql: NONREACTIVE
Rh Factor: POSITIVE
Rubella Antibodies, IGG: 2.51 index (ref >0.99)
Varicella zoster IgG: 948 index (ref >165)
WBC: 5.3 10*3/uL (ref 3.4–10.8)

## 2020-02-11 LAB — URINE DRUG PANEL 7
Amphetamines, Urine: NEGATIVE ng/mL
Barbiturate Quant, Ur: NEGATIVE ng/mL
Benzodiazepine Quant, Ur: NEGATIVE ng/mL
Cannabinoid Quant, Ur: NEGATIVE ng/mL
Cocaine (Metab.): NEGATIVE ng/mL
Opiate Quant, Ur: NEGATIVE ng/mL
PCP Quant, Ur: NEGATIVE ng/mL

## 2020-02-11 LAB — URINE CULTURE: Organism ID, Bacteria: NO GROWTH

## 2020-02-11 LAB — CYTOLOGY - PAP
Chlamydia: NEGATIVE
Comment: NEGATIVE
Comment: NORMAL
Diagnosis: NEGATIVE
Neisseria Gonorrhea: NEGATIVE

## 2020-02-24 ENCOUNTER — Ambulatory Visit (INDEPENDENT_AMBULATORY_CARE_PROVIDER_SITE_OTHER): Payer: Medicaid Other

## 2020-02-24 ENCOUNTER — Other Ambulatory Visit: Payer: Medicaid Other

## 2020-02-24 ENCOUNTER — Encounter: Payer: Self-pay | Admitting: Advanced Practice Midwife

## 2020-02-24 ENCOUNTER — Other Ambulatory Visit: Payer: Self-pay

## 2020-02-24 ENCOUNTER — Other Ambulatory Visit: Payer: Self-pay | Admitting: Certified Nurse Midwife

## 2020-02-24 ENCOUNTER — Ambulatory Visit (INDEPENDENT_AMBULATORY_CARE_PROVIDER_SITE_OTHER): Payer: Medicaid Other | Admitting: Advanced Practice Midwife

## 2020-02-24 VITALS — BP 120/80 | Wt 284.0 lb

## 2020-02-24 DIAGNOSIS — O468X1 Other antepartum hemorrhage, first trimester: Secondary | ICD-10-CM | POA: Diagnosis not present

## 2020-02-24 DIAGNOSIS — O0990 Supervision of high risk pregnancy, unspecified, unspecified trimester: Secondary | ICD-10-CM

## 2020-02-24 DIAGNOSIS — O3411 Maternal care for benign tumor of corpus uteri, first trimester: Secondary | ICD-10-CM | POA: Diagnosis not present

## 2020-02-24 DIAGNOSIS — Z3689 Encounter for other specified antenatal screening: Secondary | ICD-10-CM

## 2020-02-24 DIAGNOSIS — O99211 Obesity complicating pregnancy, first trimester: Secondary | ICD-10-CM

## 2020-02-24 DIAGNOSIS — Z3A08 8 weeks gestation of pregnancy: Secondary | ICD-10-CM

## 2020-02-24 DIAGNOSIS — D252 Subserosal leiomyoma of uterus: Secondary | ICD-10-CM | POA: Diagnosis not present

## 2020-02-24 DIAGNOSIS — O09891 Supervision of other high risk pregnancies, first trimester: Secondary | ICD-10-CM

## 2020-02-24 DIAGNOSIS — O3441 Maternal care for other abnormalities of cervix, first trimester: Secondary | ICD-10-CM

## 2020-02-24 DIAGNOSIS — Z131 Encounter for screening for diabetes mellitus: Secondary | ICD-10-CM

## 2020-02-24 LAB — POCT URINALYSIS DIPSTICK OB
Glucose, UA: NEGATIVE
POC,PROTEIN,UA: NEGATIVE

## 2020-02-24 NOTE — Progress Notes (Signed)
  Routine Prenatal Care Visit  Subjective  Adrienne Clarke is a 34 y.o. G4P3003 at [redacted]w[redacted]d being seen today for ongoing prenatal care.  She is currently monitored for the following issues for this high-risk pregnancy and has Morbid obesity with BMI of 45.0-49.9, adult ([place]); History of LEEP (loop electrosurgical excision procedure) of cervix complicating pregnancy; Supervision of high risk pregnancy, antepartum; and History of cervical dysplasia on their problem list.  ----------------------------------------------------------------------------------- Patient reports no complaints.  We reviewed recommendations from initial prenatal visit. She is taking folic acid 1 mg, she will start baby asa at 12 weeks, cervical length u/s to be done at 16 weeks. She will return in 4 weeks for rob and MaterniT 57.  . Vag. Bleeding: None.   . Leaking Fluid denies.  ----------------------------------------------------------------------------------- The following portions of the patient's history were reviewed and updated as appropriate: allergies, current medications, past family history, past medical history, past social history, past surgical history and problem list. Problem list updated.  Objective  Blood pressure 120/80, weight 284 lb (128.8 kg), last menstrual period 12/28/2019. Pregravid weight 275 lb (124.7 kg) Total Weight Gain 9 lb (4.082 kg) Urinalysis: Urine Protein Negative  Urine Glucose Negative  Fetal Status: Fetal Heart Rate (bpm): 171         Dating scan today: [redacted]w[redacted]d equals LMP, no adjustment of EDD  General:  Alert, oriented and cooperative. Patient is in no acute distress.  Skin: Skin is warm and dry. No rash noted.   Cardiovascular: Normal heart rate noted  Respiratory: Normal respiratory effort, no problems with respiration noted  Abdomen: Soft, gravid, appropriate for gestational age. Pain/Pressure: Absent     Pelvic:  Cervical exam deferred        Extremities: Normal range of motion.      Mental Status: Normal mood and affect. Normal behavior. Normal judgment and thought content.   Assessment   34 y.o. [id number] at [redacted]w[redacted]d by  10/03/2020, by Last Menstrual Period presenting for routine prenatal visit  Plan   FOURTH Problems (from 02/09/20 to present)    No problems associated with this episode.       Preterm labor symptoms and general obstetric precautions including but not limited to vaginal bleeding, contractions, leaking of fluid and fetal movement were reviewed in detail with the patient. Please refer to After Visit Summary for other counseling recommendations.   Start baby ASA at 12 weeks  Return in about 4 weeks (around 03/23/2020) for rob and MaterniT [name], [place] 02/24/2020 10:40 AM

## 2020-02-24 NOTE — Patient Instructions (Signed)
Eating Plan for Pregnant Women While you are pregnant, your body requires additional nutrition to help support your growing baby. You also have a higher need for some vitamins and minerals, such as folic acid, calcium, iron, and vitamin D. Eating a healthy, well-balanced diet is very important for your health and your baby's health. Your need for extra calories varies for the three 80-monthsegments of your pregnancy (trimesters). For most women, it is recommended to consume:  150 extra calories a day during the first trimester.  300 extra calories a day during the second trimester.  300 extra calories a day during the third trimester. What are tips for following this plan?   Do not try to lose weight or go on a diet during pregnancy.  Limit your overall intake of foods that have "empty calories." These are foods that have little nutritional value, such as sweets, desserts, candies, and sugar-sweetened beverages.  Eat a variety of foods (especially fruits and vegetables) to get a full range of vitamins and minerals.  Take a prenatal vitamin to help meet your additional vitamin and mineral needs during pregnancy, specifically for folic acid, iron, calcium, and vitamin D.  Remember to stay active. Ask your health care provider what types of exercise and activities are safe for you.  Practice good food safety and cleanliness. Wash your hands before you eat and after you prepare raw meat. Wash all fruits and vegetables well before peeling or eating. Taking these actions can help to prevent food-borne illnesses that can be very dangerous to your baby, such as listeriosis. Ask your health care provider for more information about listeriosis. What does 150 extra calories look like? Healthy options that provide 150 extra calories each day could be any of the following:  6-8 oz (170-230 g) of plain low-fat yogurt with  cup of berries.  1 apple with 2 teaspoons (11 g) of peanut butter.  Cut-up  vegetables with  cup (60 g) of hummus.  8 oz (230 mL) or 1 cup of low-fat chocolate milk.  1 stick of string cheese with 1 medium orange.  1 peanut butter and jelly sandwich that is made with one slice of whole-wheat bread and 1 tsp (5 g) of peanut butter. For 300 extra calories, you could eat two of those healthy options each day. What is a healthy amount of weight to gain? The right amount of weight gain for you is based on your BMI before you became pregnant. If your BMI:  Was less than 18 (underweight), you should gain 28-40 lb (13-18 kg).  Was 18-24.9 (normal), you should gain 25-35 lb (11-16 kg).  Was 25-29.9 (overweight), you should gain 15-25 lb (7-11 kg).  Was 30 or greater (obese), you should gain 11-20 lb (5-9 kg). What if I am having twins or multiples? Generally, if you are carrying twins or multiples:  You may need to eat 300-600 extra calories a day.  The recommended range for total weight gain is 25-54 lb (11-25 kg), depending on your BMI before pregnancy.  Talk with your health care provider to find out about nutritional needs, weight gain, and exercise that is right for you. What foods can I eat?  Fruits All fruits. Eat a variety of colors and types of fruit. Remember to wash your fruits well before peeling or eating. Vegetables All vegetables. Eat a variety of colors and types of vegetables. Remember to wash your vegetables well before peeling or eating. Grains All grains. Choose whole grains, such  as whole-wheat bread, oatmeal, or brown rice. Meats and other protein foods Lean meats, including chicken, [place], fish, and lean cuts of beef, veal, or pork. If you eat fish or seafood, choose options that are higher in omega-3 fatty acids and lower in mercury, such as salmon, herring, mussels, trout, sardines, pollock, shrimp, crab, and lobster. Tofu. Tempeh. Beans. Eggs. Peanut butter and other nut butters. Make sure that all meats, poultry, and eggs are cooked to  food-safe temperatures or "well-done." Two or more servings of fish are recommended each week in order to get the most benefits from omega-3 fatty acids that are found in seafood. Choose fish that are lower in mercury. You can find more information online:  GuamGaming.ch Dairy Pasteurized milk and milk alternatives (such as almond milk). Pasteurized yogurt and pasteurized cheese. Cottage cheese. Sour cream. Beverages Water. Juices that contain 100% fruit juice or vegetable juice. Caffeine-free teas and decaffeinated coffee. Drinks that contain caffeine are okay to drink, but it is better to avoid caffeine. Keep your total caffeine intake to less than 200 mg each day (which is 12 oz or 355 mL of coffee, tea, or soda) or the limit as told by your health care provider. Fats and oils Fats and oils are okay to include in moderation. Sweets and desserts Sweets and desserts are okay to include in moderation. Seasoning and other foods All pasteurized condiments. The items listed above may not be a complete list of foods and beverages you can eat. Contact a dietitian for more information. What foods are not recommended? Fruits Unpasteurized fruit juices. Vegetables Raw (unpasteurized) vegetable juices. Meats and other protein foods Lunch meats, bologna, hot dogs, or other deli meats. (If you must eat those meats, reheat them until they are steaming hot.) Refrigerated pat, meat spreads from a meat counter, smoked seafood that is found in the refrigerated section of a store. Raw or undercooked meats, poultry, and eggs. Raw fish, such as sushi or sashimi. Fish that have high mercury content, such as tilefish, shark, swordfish, and king mackerel. To learn more about mercury in fish, talk with your health care provider or look for online resources, such as:  [place]Gaming.ch Dairy Raw (unpasteurized) milk and any foods that have raw milk in them. Soft cheeses, such as feta, queso blanco, queso fresco, Brie,  Camembert cheeses, blue-veined cheeses, and Panela cheese (unless it is made with pasteurized milk, which must be stated on the label). Beverages Alcohol. Sugar-sweetened beverages, such as sodas, teas, or energy drinks. Seasoning and other foods Homemade fermented foods and drinks, such as pickles, sauerkraut, or kombucha drinks. (Store-bought pasteurized versions of these are okay.) Salads that are made in a store or deli, such as ham salad, chicken salad, egg salad, tuna salad, and seafood salad. The items listed above may not be a complete list of foods and beverages you should avoid. Contact a dietitian for more information. Where to find more information To calculate the number of calories you need based on your height, weight, and activity level, you can use an online calculator such as:  MobileTransition.ch To calculate how much weight you should gain during pregnancy, you can use an online pregnancy weight gain calculator such as:  StreamingFood.com.cy Summary  While you are pregnant, your body requires additional nutrition to help support your growing baby.  Eat a variety of foods, especially fruits and vegetables to get a full range of vitamins and minerals.  Practice good food safety and cleanliness. Wash your hands before you eat  and after you prepare raw meat. Wash all fruits and vegetables well before peeling or eating. Taking these actions can help to prevent food-borne illnesses, such as listeriosis, that can be very dangerous to your baby.  Do not eat raw meat or fish. Do not eat fish that have high mercury content, such as tilefish, shark, swordfish, and king mackerel. Do not eat unpasteurized (raw) dairy.  Take a prenatal vitamin to help meet your additional vitamin and mineral needs during pregnancy, specifically for folic acid, iron, calcium, and vitamin D. This information is not intended to replace advice given to you by  your health care provider. Make sure you discuss any questions you have with your health care provider. Document Revised: 04/23/2019 Document Reviewed: 08/30/2017 Elsevier Patient Education  [place]. Exercise During Pregnancy Exercise is an important part of being healthy for people of all ages. Exercise improves the function of your heart and lungs and helps you maintain strength, flexibility, and a healthy body weight. Exercise also boosts energy levels and elevates mood. Most women should exercise regularly during pregnancy. In rare cases, women with certain medical conditions or complications may be asked to limit or avoid exercise during pregnancy. How does this affect me? Along with maintaining general strength and flexibility, exercising during pregnancy can help:  Keep strength in muscles that are used during labor and childbirth.  Decrease low back pain.  Reduce symptoms of depression.  Control weight gain during pregnancy.  Reduce the risk of needing insulin if you develop diabetes during pregnancy.  Decrease the risk of cesarean delivery.  Speed up your recovery after giving birth. How does this affect my baby? Exercise can help you have a healthy pregnancy. Exercise does not cause premature birth. It will not cause your baby to weigh less at birth. What exercises can I do? Many exercises are safe for you to do during pregnancy. Do a variety of exercises that safely increase your heart and breathing rates and help you build and maintain muscle strength. Do exercises exactly as told by your health care provider. You may do these exercises:  Walking or hiking.  Swimming.  Water aerobics.  Riding a stationary bike.  Strength training.  Modified yoga or Pilates. Tell your instructor that you are pregnant. Avoid overstretching, and avoid lying on your back for long periods of time.  Running or jogging. Only choose this type of exercise if you: ? Ran or jogged  regularly before your pregnancy. ? Can run or jog and still talk in complete sentences. What exercises should I avoid? Depending on your level of fitness and whether you exercised regularly before your pregnancy, you may be told to limit high-intensity exercise. You can tell that you are exercising at a high intensity if you are breathing much harder and faster and cannot hold a conversation while exercising. You must avoid:  Contact sports.  Activities that put you at risk for falling on or being hit in the belly, such as downhill skiing, water skiing, surfing, rock climbing, cycling, gymnastics, and horseback riding.  Scuba diving.  Skydiving.  Yoga or Pilates in a room that is heated to high temperatures.  Jogging or running, unless you ran or jogged regularly before your pregnancy. While jogging or running, you should always be able to talk in full sentences. Do not run or jog so fast that you are unable to have a conversation.  Do not exercise at more than 6,000 feet above sea level (high elevation) if you  are not used to exercising at high elevation. How do I exercise in a safe way?   Avoid overheating. Do not exercise in very high temperatures.  Wear loose-fitting, breathable clothes.  Avoid dehydration. Drink enough water before, during, and after exercise to keep your urine pale yellow.  Avoid overstretching. Because of hormone changes during pregnancy, it is easy to overstretch muscles, tendons, and ligaments during pregnancy.  Start slowly and ask your health care provider to recommend the types of exercise that are safe for you.  Do not exercise to lose weight. Follow these instructions at home:  Exercise on most days or all days of the week. Try to exercise for 30 minutes a day, 5 days a week, unless your health care provider tells you not to.  If you actively exercised before your pregnancy and you are healthy, your health care provider may tell you to continue to do  moderate to high-intensity exercise.  If you are just starting to exercise or did not exercise much before your pregnancy, your health care provider may tell you to do low to moderate-intensity exercise. Questions to ask your health care provider  Is exercise safe for me?  What are signs that I should stop exercising?  Does my health condition mean that I should not exercise during pregnancy?  When should I avoid exercising during pregnancy? Stop exercising and contact a health care provider if: You have any unusual symptoms, such as:  Mild contractions of the uterus or cramps in the abdomen.  Dizziness that does not go away when you rest. Stop exercising and get help right away if: You have any unusual symptoms, such as:  Sudden, severe pain in your low back or your belly.  Mild contractions of the uterus or cramps in the abdomen that do not improve with rest and drinking fluids.  Chest pain.  Bleeding or fluid leaking from your vagina.  Shortness of breath. These symptoms may represent a serious problem that is an emergency. Do not wait to see if the symptoms will go away. Get medical help right away. Call your local emergency services (911 in the [place] Do not drive yourself to the hospital. Summary  Most women should exercise regularly throughout pregnancy. In rare cases, women with certain medical conditions or complications may be asked to limit or avoid exercise during pregnancy.  Do not exercise to lose weight during pregnancy.  Your health care provider will tell you what level of physical activity is right for you.  Stop exercising and contact a health care provider if you have mild contractions of the uterus or cramps in the abdomen. Get help right away if these contractions or cramps do not improve with rest and drinking fluids.  Stop exercising and get help right away if you have sudden, severe pain in your low back or belly, chest pain, shortness of breath, or  bleeding or leaking of fluid from your vagina. This information is not intended to replace advice given to you by your health care provider. Make sure you discuss any questions you have with your health care provider. Document Revised: 03/26/2019 Document Reviewed: 01/07/2019 Elsevier Patient Education  2020 [employer]. Prenatal Care Prenatal care is health care during pregnancy. It helps you and your unborn baby (fetus) stay as healthy as possible. Prenatal care may be provided by a midwife, a family practice health care provider, or a childbirth and pregnancy specialist (obstetrician). How does this affect me? During pregnancy, you will be closely monitored  for any new conditions that might develop. To lower your risk of pregnancy complications, you and your health care provider will talk about any underlying conditions you have. How does this affect my baby? Early and consistent prenatal care increases the chance that your baby will be healthy during pregnancy. Prenatal care lowers the risk that your baby will be:  Born early (prematurely).  Smaller than expected at birth (small for gestational age). What can I expect at the first prenatal care visit? Your first prenatal care visit will likely be the longest. You should schedule your first prenatal care visit as soon as you know that you are pregnant. Your first visit is a good time to talk about any questions or concerns you have about pregnancy. At your visit, you and your health care provider will talk about:  Your medical history, including: ? Any past pregnancies. ? Your family's medical history. ? The baby's father's medical history. ? Any long-term (chronic) health conditions you have and how you manage them. ? Any surgeries or procedures you have had. ? Any current over-the-counter or prescription medicines, herbs, or supplements you are taking.  Other factors that could pose a risk to your baby, including:  Your home setting  and your stress levels, including: ? Exposure to abuse or violence. ? Household financial strain. ? Mental health conditions you have.  Your daily health habits, including diet and exercise. Your health care provider will also:  Measure your weight, height, and blood pressure.  Do a physical exam, including a pelvic and breast exam.  Perform blood tests and urine tests to check for: ? Urinary tract infection. ? Sexually transmitted infections (STIs). ? Low iron levels in your blood (anemia). ? Blood type and certain proteins on red blood cells (Rh antibodies). ? Infections and immunity to viruses, such as hepatitis B and rubella. ? HIV (human immunodeficiency virus).  Do an ultrasound to confirm your baby's growth and development and to help predict your estimated due date (EDD). This ultrasound is done with a probe that is inserted into the vagina (transvaginal ultrasound).  Discuss your options for genetic screening.  Give you information about how to keep yourself and your baby healthy, including: ? Nutrition and taking vitamins. ? Physical activity. ? How to manage pregnancy symptoms such as nausea and vomiting (morning sickness). ? Infections and substances that may be harmful to your baby and how to avoid them. ? Food safety. ? Dental care. ? Working. ? Travel. ? Warning signs to watch for and when to call your health care provider. How often will I have prenatal care visits? After your first prenatal care visit, you will have regular visits throughout your pregnancy. The visit schedule is often as follows:  Up to week 28 of pregnancy: once every 4 weeks.  28-36 weeks: once every 2 weeks.  After 36 weeks: every week until delivery. Some women may have visits more or less often depending on any underlying health conditions and the health of the baby. Keep all follow-up and prenatal care visits as told by your health care provider. This is important. What happens  during routine prenatal care visits? Your health care provider will:  Measure your weight and blood pressure.  Check for fetal heart sounds.  Measure the height of your uterus in your abdomen (fundal height). This may be measured starting around week 20 of pregnancy.  Check the position of your baby inside your uterus.  Ask questions about your diet, sleeping patterns, and  whether you can feel the baby move.  Review warning signs to watch for and signs of labor.  Ask about any pregnancy symptoms you are having and how you are dealing with them. Symptoms may include: ? Headaches. ? Nausea and vomiting. ? Vaginal discharge. ? Swelling. ? Fatigue. ? Constipation. ? Any discomfort, including back or pelvic pain. Make a list of questions to ask your health care provider at your routine visits. What tests might I have during prenatal care visits? You may have blood, urine, and imaging tests throughout your pregnancy, such as:  Urine tests to check for glucose, protein, or signs of infection.  Glucose tests to check for a form of diabetes that can develop during pregnancy (gestational diabetes mellitus). This is usually done around week 24 of pregnancy.  An ultrasound to check your baby's growth and development and to check for birth defects. This is usually done around week 20 of pregnancy.  A test to check for group B strep (GBS) infection. This is usually done around week 36 of pregnancy.  Genetic testing. This may include blood or imaging tests, such as an ultrasound. Some genetic tests are done during the first trimester and some are done during the second trimester. What else can I expect during prenatal care visits? Your health care provider may recommend getting certain vaccines during pregnancy. These may include:  A yearly flu shot (annual influenza vaccine). This is especially important if you will be pregnant during flu season.  Tdap (tetanus, diphtheria, pertussis)  vaccine. Getting this vaccine during pregnancy can protect your baby from whooping cough (pertussis) after birth. This vaccine may be recommended between weeks 27 and 36 of pregnancy. Later in your pregnancy, your health care provider may give you information about:  Childbirth and breastfeeding classes.  Choosing a health care provider for your baby.  Umbilical cord banking.  Breastfeeding.  Birth control after your baby is born.  The hospital labor and delivery unit and how to tour it.  Registering at the hospital before you go into labor. Where to find more information  Office on Women's Health: LegalWarrants.gl  American Pregnancy Association: americanpregnancy.org  March of Dimes: marchofdimes.org Summary  Prenatal care helps you and your baby stay as healthy as possible during pregnancy.  Your first prenatal care visit will most likely be the longest.  You will have visits and tests throughout your pregnancy to monitor your health and your baby's health.  Bring a list of questions to your visits to ask your health care provider.  Make sure to keep all follow-up and prenatal care visits with your health care provider. This information is not intended to replace advice given to you by your health care provider. Make sure you discuss any questions you have with your health care provider. Document Revised: 03/25/2019 Document Reviewed: 12/02/2017 Elsevier Patient Education  [place].     COVID-19 and Your Pregnancy FAQ  How can I prevent infection with COVID-19 during my pregnancy? Social distancing is key. Please limit any interactions in public. Try and work from home if possible. Frequently wash your hands after touching possibly contaminated surfaces. Avoid touching your face.  Minimize trips to the store. Consider online ordering when possible.   Should I wear a mask? YES. It is recommended by the CDC that all people wear a cloth mask or facial  covering in public. You should wear a mask to your visits in the office. This will help reduce transmission as well as your  risk or acquiring COVID-19. New studies are showing that even asymptomatic individuals can spread the virus from talking.   Where can I get a mask? [place] and the city of [place] are partnering to provide masks to community members. You can pick up a mask from several locations. This website also has instructions about how to make a mask by sewing or without sewing by using a t-shirt or bandana.  https://www.[place].gov/i-want-to/learn-about/covid-19-information-and-updates/covid-19-face-mask-project  Studies have shown that if you were a tube or nylon stocking from pantyhose over a cloth mask it makes the cloth mask almost as effective as a N95 mask.  https://www.davis-walter.com/  What are the symptoms of COVID-19? Fever (greater than 100.4 F), dry cough, shortness of breath.  Am I more at risk for COVID-19 since I am pregnant? There is not currently data showing that pregnant women are more adversely impacted by COVID-19 than the general population. However, we know that pregnant women tend to have worse respiratory complications from similar diseases such as the flu and SARS and for this reason should be considered an at-risk population.  What do I do if I am experiencing the symptoms of COVID-19? Testing is being limited because of test availability. If you are experiencing symptoms you should quarantine yourself, and the members of your family, for at least 2 weeks at home.   Please visit this website for more information: RunningShows.co.za.html  When should I go to the Emergency Room? Please go to the emergency room if you are experiencing ANY of these symptoms*:  1.    Difficulty  breathing or shortness of breath 2.    Persistent pain or pressure in the chest 3.    Confusion or difficulty being aroused (or awakened) 4.    Bluish lips or face  *This list is not all inclusive. Please consult our office for any other symptoms that are severe or concerning.  What do I do if I am having difficulty breathing? You should go to the Emergency Room for evaluation. At this time they have a tent set up for evaluating patients with COVID-19 symptoms.   How will my prenatal care be different because of the COVID-19 pandemic? It has been recommended to reduce the frequency of face-to-face visits and use resources such as telephone and virtual visits when possible. Using a scale, blood pressure machine and fetal doppler at home can further help reduce face-to-face visits. You will be provided with additional information on this topic.  We ask that you come to your visits alone to minimize potential exposures to  COVID-19.  How can I receive childbirth education? At this time in-person classes have been cancelled. You can register for online childbirth education, breastfeeding, and newborn care classes.  Please visit:  CyberComps.hu for more information  How will my hospital birth experience be different? The hospital is currently limiting visitors. This means that while you are in labor you can only have one person at the hospital with you. Additional family members will not be allowed to wait in the building or outside your room. Your one support person can be the father of the baby, a relative, a doula, or a friend. Once one support person is designated that person will wear a band. This band cannot be shared with multiple people.  Nitrous Gas is not being offered for pain relief since the tubing and filter for the machine can not be sanitized in a way to guarantee prevention of transmission of COVID-19.  Nasal cannula use of oxygen  for fetal indications has also been  discontinued.  Currently a clear plastic sheet is being hung between mom and the delivering provider during pushing and delivery to help prevent transmission of COVID-19.      How long will I stay in the hospital for after giving birth? It is also recommended that discharge home be expedited during the COVID-19 outbreak. This means staying for 1 day after a vaginal delivery and 2 days after a cesarean section. Patients who need to stay longer for medical reasons are allowed to do so, but the goal will be for expedited discharge home.   What if I have COVID-19 and I am in labor? We ask that you wear a mask while on labor and delivery. We will try and accommodate you being placed in a room that is capable of filtering the air. Please call ahead if you are in labor and on your way to the hospital. The phone number for labor and delivery at [facility] is [phone].  If I have COVID-19 when my baby is born how can I prevent my baby from contracting COVID-19? This is an issue that will have to be discussed on a case-by-case basis. Current recommendations suggest providing separate isolation rooms for both the mother and new infant as well as limiting visitors. However, there are practical challenges to this recommendation. The situation will assuredly change and decisions will be influenced by the desires of the mother and availability of space.  Some suggestions are the use of a curtain or physical barrier between mom and infant, hand hygiene, mom wearing a mask, or 6 feet of spacing between a mom and infant.   Can I breastfeed during the COVID-19 pandemic?   Yes, breastfeeding is encouraged.  Can I breastfeed if I have COVID-19? Yes. Covid-19 has not been found in breast milk. This means you cannot give COVID-19 to your child through breast milk. Breast feeding will also help pass antibodies to fight infection to your baby.   What precautions should I take when breastfeeding  if I have COVID-19? If a mother and newborn do room-in and the mother wishes to feed at the breast, she should put on a facemask and practice hand hygiene before each feeding.  What precautions should I take when pumping if I have COVID-19? Prior to expressing breast milk, mothers should practice hand hygiene. After each pumping session, all parts that come into contact with breast milk should be thoroughly washed and the entire pump should be appropriately disinfected per the manufacturer's instructions. This expressed breast milk should be fed to the newborn by a healthy caregiver.  What if I am pregnant and work in healthcare? Based on limited data regarding COVID-19 and pregnancy, ACOG currently does not propose creating additional restrictions on pregnant health care personnel because of COVID-19 alone. Pregnant women do not appear to be at higher risk of severe disease related to COVID-19. Pregnant health care personnel should follow CDC risk assessment and infection control guidelines for health care personnel exposed to patients with suspected or confirmed COVID-19. Adherence to recommended infection prevention and control practices is an important part of protecting all health care personnel in health care settings.    Information on COVID-19 in pregnancy is very limited; however, facilities may want to consider limiting exposure of pregnant health care personnel to patients with confirmed or suspected COVID-19 infection, especially during higher-risk procedures (eg, aerosol-generating procedures), if feasible, based on staffing availability.

## 2020-02-25 LAB — GLUCOSE, 1 HOUR GESTATIONAL: Gestational Diabetes Screen: 106 mg/dL (ref 65–139)

## 2020-03-23 ENCOUNTER — Other Ambulatory Visit: Payer: Self-pay

## 2020-03-23 ENCOUNTER — Encounter: Payer: Self-pay | Admitting: Obstetrics and Gynecology

## 2020-03-23 ENCOUNTER — Ambulatory Visit (INDEPENDENT_AMBULATORY_CARE_PROVIDER_SITE_OTHER): Payer: Medicaid Other | Admitting: Obstetrics and Gynecology

## 2020-03-23 VITALS — BP 110/70 | Wt 287.0 lb

## 2020-03-23 DIAGNOSIS — O99211 Obesity complicating pregnancy, first trimester: Secondary | ICD-10-CM

## 2020-03-23 DIAGNOSIS — Z1379 Encounter for other screening for genetic and chromosomal anomalies: Secondary | ICD-10-CM

## 2020-03-23 DIAGNOSIS — O99213 Obesity complicating pregnancy, third trimester: Secondary | ICD-10-CM | POA: Insufficient documentation

## 2020-03-23 DIAGNOSIS — O3442 Maternal care for other abnormalities of cervix, second trimester: Secondary | ICD-10-CM

## 2020-03-23 DIAGNOSIS — O99212 Obesity complicating pregnancy, second trimester: Secondary | ICD-10-CM

## 2020-03-23 DIAGNOSIS — O0990 Supervision of high risk pregnancy, unspecified, unspecified trimester: Secondary | ICD-10-CM

## 2020-03-23 DIAGNOSIS — Z3A12 12 weeks gestation of pregnancy: Secondary | ICD-10-CM

## 2020-03-23 DIAGNOSIS — O3441 Maternal care for other abnormalities of cervix, first trimester: Secondary | ICD-10-CM

## 2020-03-23 DIAGNOSIS — Z9889 Other specified postprocedural states: Secondary | ICD-10-CM

## 2020-03-23 NOTE — Progress Notes (Signed)
ROB C/o cold Denies cramping/spotting

## 2020-03-23 NOTE — Progress Notes (Signed)
Routine Prenatal Care Visit  Subjective  Adrienne Clarke is a 34 y.o. G4P3003 at [redacted]w[redacted]d being seen today for ongoing prenatal care.  She is currently monitored for the following issues for this high-risk pregnancy and has Morbid obesity with BMI of 45.0-49.9, adult ([place]); History of LEEP (loop electrosurgical excision procedure) of cervix complicating pregnancy; Supervision of high risk pregnancy, antepartum; and History of cervical dysplasia on their problem list.  ----------------------------------------------------------------------------------- Patient reports no complaints.   Contractions: Not present. Vag. Bleeding: None.  Movement: Absent. Denies leaking of fluid.  ----------------------------------------------------------------------------------- The following portions of the patient's history were reviewed and updated as appropriate: allergies, current medications, past family history, past medical history, past social history, past surgical history and problem list. Problem list updated.   Objective  Blood pressure 110/70, weight 287 lb (130.2 kg), last menstrual period 12/28/2019. Pregravid weight 275 lb (124.7 kg) Total Weight Gain 12 lb (5.443 kg) Urinalysis:      Fetal Status: Fetal Heart Rate (bpm): 166   Movement: Absent     General:  Alert, oriented and cooperative. Patient is in no acute distress.  Skin: Skin is warm and dry. No rash noted.   Cardiovascular: Normal heart rate noted  Respiratory: Normal respiratory effort, no problems with respiration noted  Abdomen: Soft, gravid, appropriate for gestational age. Pain/Pressure: Absent     Pelvic:  Cervical exam deferred        Extremities: Normal range of motion.  Edema: None  Mental Status: Normal mood and affect. Normal behavior. Normal judgment and thought content.     Assessment   34 y.o. G4P3003 at [redacted]w[redacted]d by  10/03/2020, by Last Menstrual Period presenting for routine prenatal visit  Plan   FOURTH Problems  (from 02/09/20 to present)    Problem Noted Resolved   Obesity affecting pregnancy in second trimester 03/23/2020 by Homero Fellers, MD No   History of LEEP (loop electrosurgical excision procedure) of cervix complicating pregnancy [id number] by Dalia Heading, CNM No   Overview Signed 02/10/2020  4:37 PM by Dalia Heading, CNM    Cervical length at 16 weeks      Supervision of high risk pregnancy, antepartum 02/10/2020 by Dalia Heading, CNM No   Overview Addendum 03/23/2020  9:27 AM by Homero Fellers, [place] Prenatal Labs  Dating By LMP=8w Blood type: O/Positive/-- (02/23 1233)   Genetic Screen  NIPS: Antibody:Negative (02/23 1233)  Anatomic [place]  Rubella: 2.51 (02/23 1233) Varicella: Immune  GTT Early:  106 Third trimester:  RPR: Non Reactive (02/23 1233)   Rhogam  not needed HBsAg: Negative (02/23 1233)   Vaccines TDAP:                       Flu Shot: HIV: Non Reactive (02/23 1233)   Baby Food                                GBS:   Contraception  Pap: 2021 NIL  CBB     CS/VBAC    Support Person            Cervical length [place] next visit Discussed initiation of 81 mg ASA daily Maternit21 testing today.  Gestational age appropriate obstetric precautions including but not limited to vaginal bleeding, contractions, leaking of fluid and fetal movement were reviewed in detail with the patient.    Return in about 4  weeks (around 04/20/2020) for [place] in personand [place] for cervical length.  Homero Fellers MD [facility] 03/23/2020, 9:41 AM

## 2020-03-23 NOTE — Patient Instructions (Signed)

## 2020-03-27 LAB — MATERNIT21 PLUS CORE+SCA
Fetal Fraction: 7
Monosomy X (Turner Syndrome): NOT DETECTED
Result (T21): NEGATIVE
Trisomy 13 (Patau syndrome): NEGATIVE
Trisomy 18 (Edwards syndrome): NEGATIVE
Trisomy 21 (Down syndrome): NEGATIVE
XXX (Triple X Syndrome): NOT DETECTED
XXY (Klinefelter Syndrome): NOT DETECTED
XYY (Jacobs Syndrome): NOT DETECTED

## 2020-04-20 ENCOUNTER — Other Ambulatory Visit: Payer: Self-pay

## 2020-04-20 ENCOUNTER — Ambulatory Visit (INDEPENDENT_AMBULATORY_CARE_PROVIDER_SITE_OTHER): Payer: Medicaid Other | Admitting: Advanced Practice Midwife

## 2020-04-20 ENCOUNTER — Ambulatory Visit (INDEPENDENT_AMBULATORY_CARE_PROVIDER_SITE_OTHER): Payer: Medicaid Other

## 2020-04-20 ENCOUNTER — Encounter: Payer: Self-pay | Admitting: Advanced Practice Midwife

## 2020-04-20 VITALS — BP 120/80 | Wt 286.0 lb

## 2020-04-20 DIAGNOSIS — O3442 Maternal care for other abnormalities of cervix, second trimester: Secondary | ICD-10-CM

## 2020-04-20 DIAGNOSIS — Z3A16 16 weeks gestation of pregnancy: Secondary | ICD-10-CM

## 2020-04-20 DIAGNOSIS — Z9889 Other specified postprocedural states: Secondary | ICD-10-CM

## 2020-04-20 DIAGNOSIS — O0992 Supervision of high risk pregnancy, unspecified, second trimester: Secondary | ICD-10-CM | POA: Diagnosis not present

## 2020-04-20 NOTE — Progress Notes (Signed)
Routine Prenatal Care Visit  Subjective  Adrienne Clarke is a 34 y.o. G4P3003 at [redacted]w[redacted]d being seen today for ongoing prenatal care.  She is currently monitored for the following issues for this high-risk pregnancy and has Morbid obesity with BMI of 45.0-49.9, adult ([place]); History of LEEP (loop electrosurgical excision procedure) of cervix complicating pregnancy; Supervision of high risk pregnancy, antepartum; History of cervical dysplasia; and Obesity affecting pregnancy in second trimester on their problem list.  ----------------------------------------------------------------------------------- Patient reports no complaints.  We discussed results of normal cervical length ultrasound.  Contractions: Not present. Vag. Bleeding: None.  Movement: Present. Leaking Fluid denies.  ----------------------------------------------------------------------------------- The following portions of the patient's history were reviewed and updated as appropriate: allergies, current medications, past family history, past medical history, past social history, past surgical history and problem list. Problem list updated.  Objective  Blood pressure 120/80, weight 286 lb (129.7 kg), last menstrual period 12/28/2019. Pregravid weight 275 lb (124.7 kg) Total Weight Gain 11 lb (4.99 kg) Urinalysis: Urine Protein    Urine Glucose    Fetal Status: Fetal Heart Rate (bpm): 153   Movement: Present      Cervical length ultrasound: 3.4 cm, no change with fundal pressure  General:  Alert, oriented and cooperative. Patient is in no acute distress.  Skin: Skin is warm and dry. No rash noted.   Cardiovascular: Normal heart rate noted  Respiratory: Normal respiratory effort, no problems with respiration noted  Abdomen: Soft, gravid, appropriate for gestational age. Pain/Pressure: Absent     Pelvic:  Cervical exam deferred        Extremities: Normal range of motion.  Edema: None  Mental Status: Normal mood and affect. Normal  behavior. Normal judgment and thought content.   Assessment   34 y.o. [id number] at [redacted]w[redacted]d by  10/03/2020, by Last Menstrual Period presenting for routine prenatal visit  Plan   FOURTH Problems (from 02/09/20 to present)    Problem Noted Resolved   Obesity affecting pregnancy in second trimester 03/23/2020 by Homero Fellers, MD No   History of LEEP (loop electrosurgical excision procedure) of cervix complicating pregnancy [id number] by Dalia Heading, CNM No   Overview Signed 02/10/2020  4:37 PM by Dalia Heading, CNM    Cervical length at 16 weeks      Supervision of high risk pregnancy, antepartum 02/10/2020 by Dalia Heading, CNM No   Overview Addendum 03/23/2020  9:27 AM by Homero Fellers, [place] Prenatal Labs  Dating By LMP=8w Blood type: O/Positive/-- (02/23 1233)   Genetic Screen  NIPS: Antibody:Negative (02/23 1233)  Anatomic [place]  Rubella: 2.51 (02/23 1233) Varicella: Immune  GTT Early:  106 Third trimester:  RPR: Non Reactive (02/23 1233)   Rhogam  not needed HBsAg: Negative (02/23 1233)   Vaccines TDAP:                       Flu Shot: HIV: Non Reactive (02/23 1233)   Baby Food                                GBS:   Contraception  Pap: 2021 NIL  CBB     CS/VBAC    Support Person               Preterm labor symptoms and general obstetric precautions including but not limited to vaginal bleeding, contractions, leaking of fluid and fetal movement  were reviewed in detail with the patient.    Return in about 4 weeks (around 05/18/2020) for anatomy scan and rob.  Rod Can, CNM 04/20/2020 10:40 AM

## 2020-05-18 ENCOUNTER — Ambulatory Visit (INDEPENDENT_AMBULATORY_CARE_PROVIDER_SITE_OTHER): Payer: Medicaid Other

## 2020-05-18 ENCOUNTER — Encounter: Payer: Self-pay | Admitting: Advanced Practice Midwife

## 2020-05-18 ENCOUNTER — Other Ambulatory Visit: Payer: Self-pay

## 2020-05-18 ENCOUNTER — Ambulatory Visit (INDEPENDENT_AMBULATORY_CARE_PROVIDER_SITE_OTHER): Payer: Medicaid Other | Admitting: Advanced Practice Midwife

## 2020-05-18 VITALS — BP 122/74 | Wt 287.0 lb

## 2020-05-18 DIAGNOSIS — O0992 Supervision of high risk pregnancy, unspecified, second trimester: Secondary | ICD-10-CM

## 2020-05-18 DIAGNOSIS — Z3A20 20 weeks gestation of pregnancy: Secondary | ICD-10-CM

## 2020-05-18 DIAGNOSIS — O99212 Obesity complicating pregnancy, second trimester: Secondary | ICD-10-CM

## 2020-05-18 NOTE — Progress Notes (Signed)
Routine Prenatal Care Visit  Subjective  Adrienne Clarke is a 34 y.o. G4P3003 at [redacted]w[redacted]d being seen today for ongoing prenatal care.  She is currently monitored for the following issues for this high-risk pregnancy and has Morbid obesity with BMI of 45.0-49.9, adult ([place]); History of LEEP (loop electrosurgical excision procedure) of cervix complicating pregnancy; Supervision of high risk pregnancy, antepartum; History of cervical dysplasia; and Obesity affecting pregnancy in second trimester on their problem list.  ----------------------------------------------------------------------------------- Patient reports mild heartburn.  We discussed results of anatomy scan and the need for follow up. Her fiance was unable to come today for scan and can come to follow up if he is able.  Contractions: Not present. Vag. Bleeding: None.  Movement: Present. Leaking Fluid denies.  ----------------------------------------------------------------------------------- The following portions of the patient's history were reviewed and updated as appropriate: allergies, current medications, past family history, past medical history, past social history, past surgical history and problem list. Problem list updated.  Objective  Blood pressure 122/74, weight 287 lb (130.2 kg), last menstrual period 12/28/2019. Pregravid weight 275 lb (124.7 kg) Total Weight Gain 12 lb (5.443 kg) Urinalysis: Urine Protein    Urine Glucose    Fetal Status: Fetal Heart Rate (bpm): 144   Movement: Present      Anatomy scan: incomplete for DA, AA, orbits, nose, lips, profile and normal, female, cephalic, placenta posterior  General:  Alert, oriented and cooperative. Patient is in no acute distress.  Skin: Skin is warm and dry. No rash noted.   Cardiovascular: Normal heart rate noted  Respiratory: Normal respiratory effort, no problems with respiration noted  Abdomen: Soft, gravid, appropriate for gestational age. Pain/Pressure: Absent      Pelvic:  Cervical exam deferred        Extremities: Normal range of motion.     Mental Status: Normal mood and affect. Normal behavior. Normal judgment and thought content.   Assessment   34 y.o. G4P3003 at [redacted]w[redacted]d by  10/03/2020, by Last Menstrual Period presenting for routine prenatal visit  Plan   FOURTH Problems (from 02/09/20 to present)    Problem Noted Resolved   Obesity affecting pregnancy in second trimester 03/23/2020 by Homero Fellers, MD No   History of LEEP (loop electrosurgical excision procedure) of cervix complicating pregnancy [id number] by Dalia Heading, CNM No   Overview Signed 02/10/2020  4:37 PM by Dalia Heading, CNM    Cervical length at 16 weeks      Supervision of high risk pregnancy, antepartum 02/10/2020 by Dalia Heading, CNM No   Overview Addendum 03/23/2020  9:27 AM by Homero Fellers, [place] Prenatal Labs  Dating By LMP=8w Blood type: O/Positive/-- (02/23 1233)   Genetic Screen  NIPS: Antibody:Negative (02/23 1233)  Anatomic [place]  Rubella: 2.51 (02/23 1233) Varicella: Immune  GTT Early:  106 Third trimester:  RPR: Non Reactive (02/23 1233)   Rhogam  not needed HBsAg: Negative (02/23 1233)   Vaccines TDAP:                       Flu Shot: HIV: Non Reactive (02/23 1233)   Baby Food                                GBS:   Contraception  Pap: 2021 NIL  CBB     CS/VBAC    Support Person  Preterm labor symptoms and general obstetric precautions including but not limited to vaginal bleeding, contractions, leaking of fluid and fetal movement were reviewed in detail with the patient.   Return in about 4 weeks (around 06/15/2020) for follow up anatomy and rob.  Rod Can, CNM 05/18/2020 11:17 AM

## 2020-05-18 NOTE — Progress Notes (Signed)
Anatomy scan today. No vb. No lof.  ?

## 2020-06-09 ENCOUNTER — Encounter: Payer: Self-pay | Admitting: Obstetrics and Gynecology

## 2020-06-09 ENCOUNTER — Other Ambulatory Visit: Payer: Self-pay

## 2020-06-09 ENCOUNTER — Observation Stay
Admission: EM | Admit: 2020-06-09 | Discharge: 2020-06-10 | Disposition: A | Discharge status: Home / Self Care | Payer: Medicaid Other | Attending: Obstetrics and Gynecology | Admitting: Obstetrics and Gynecology

## 2020-06-09 DIAGNOSIS — Z3A23 23 weeks gestation of pregnancy: Secondary | ICD-10-CM | POA: Insufficient documentation

## 2020-06-09 DIAGNOSIS — Z79899 Other long term (current) drug therapy: Secondary | ICD-10-CM | POA: Insufficient documentation

## 2020-06-09 DIAGNOSIS — R109 Unspecified abdominal pain: Secondary | ICD-10-CM | POA: Diagnosis present

## 2020-06-09 DIAGNOSIS — O26892 Other specified pregnancy related conditions, second trimester: Secondary | ICD-10-CM | POA: Diagnosis not present

## 2020-06-09 DIAGNOSIS — O0990 Supervision of high risk pregnancy, unspecified, unspecified trimester: Secondary | ICD-10-CM

## 2020-06-09 DIAGNOSIS — O99212 Obesity complicating pregnancy, second trimester: Secondary | ICD-10-CM

## 2020-06-09 LAB — URINALYSIS, COMPLETE (UACMP) WITH MICROSCOPIC
Bacteria, UA: NONE SEEN
Bilirubin Urine: NEGATIVE
Glucose, UA: NEGATIVE mg/dL
Hgb urine dipstick: NEGATIVE
Ketones, ur: NEGATIVE mg/dL
Leukocytes,Ua: NEGATIVE
Nitrite: NEGATIVE
Protein, ur: NEGATIVE mg/dL
Specific Gravity, Urine: 1.02 (ref 1.005–1.030)
pH: 5 (ref 5.0–8.0)

## 2020-06-09 LAB — CBC
HCT: 33.8 % — ABNORMAL LOW (ref 36.0–46.0)
Hemoglobin: 11.3 g/dL — ABNORMAL LOW (ref 12.0–15.0)
MCH: 28.2 pg (ref 26.0–34.0)
MCHC: 33.4 g/dL (ref 30.0–36.0)
MCV: 84.3 fL (ref 80.0–100.0)
Platelets: 202 10*3/uL (ref 150–400)
RBC: 4.01 MIL/uL (ref 3.87–5.11)
RDW: 14.2 % (ref 11.5–15.5)
WBC: 8.4 10*3/uL (ref 4.0–10.5)
nRBC: 0 % (ref 0.0–0.2)

## 2020-06-09 NOTE — OB Triage Note (Signed)
[facility] with a complaint of abdominal pain. The pain began around 0730 06/09/2020. The pain started on the right side and now is on the lower part of the abdomen. Pt rates the pain 7/10 and describes feeling like menstrual cramps. Pt denies bleeding or discharge. Pt states that she has been able to feel her baby move. Initial FHT 155.

## 2020-06-10 DIAGNOSIS — R109 Unspecified abdominal pain: Secondary | ICD-10-CM | POA: Diagnosis not present

## 2020-06-10 DIAGNOSIS — O26892 Other specified pregnancy related conditions, second trimester: Secondary | ICD-10-CM | POA: Diagnosis not present

## 2020-06-10 LAB — COMPREHENSIVE METABOLIC PANEL
ALT: 12 U/L (ref 0–44)
AST: 12 U/L — ABNORMAL LOW (ref 15–41)
Albumin: 3.2 g/dL — ABNORMAL LOW (ref 3.5–5.0)
Alkaline Phosphatase: 50 U/L (ref 38–126)
Anion gap: 7 (ref 5–15)
BUN: 7 mg/dL (ref 6–20)
CO2: 26 mmol/L (ref 22–32)
Calcium: 8.9 mg/dL (ref 8.9–10.3)
Chloride: 103 mmol/L (ref 98–111)
Creatinine, Ser: 0.74 mg/dL (ref 0.44–1.00)
GFR calc Af Amer: >60 mL/min (ref >60)
GFR calc non Af Amer: >60 mL/min (ref >60)
Glucose, Bld: 99 mg/dL (ref 70–99)
Potassium: 4 mmol/L (ref 3.5–5.1)
Sodium: 136 mmol/L (ref 135–145)
Total Bilirubin: 0.5 mg/dL (ref 0.3–1.2)
Total Protein: 6.7 g/dL (ref 6.5–8.1)

## 2020-06-10 LAB — LIPASE, BLOOD: Lipase: 27 U/L (ref 11–51)

## 2020-06-10 NOTE — OB Triage Note (Signed)
Pt discharged home in stable condition. RN provided discharge instructions to the patient. Pt verbalized understanding, all questions answered at this time.

## 2020-06-10 NOTE — Discharge Instructions (Signed)
LABOR: When contractions begin, you should start to time them from the beginning of one contraction to the beginning of the next.  When contractions are 5-10 minutes apart or less and have been regular for at least an hour, you should call your health care provider.  Notify your doctor if any of the following occur: 1. Bleeding from the vagina 7. Sudden, constant, or occasional abdominal pain  2. Pain or burning when urinating 8. Sudden gushing of fluid from the vagina (with or without continued leaking)  3. Chills or fever 9. Fainting spells, "black outs" or loss of consciousness  4. Increase in vaginal discharge 10. Severe or continued nausea or vomiting  5. Pelvic pressure (sudden increase) 11. Blurring of vision or spots before the eyes  6. Baby moving less than usual 12. Leaking of fluid    FETAL KICK COUNT: Lie on your left side for one hour after a meal, and count the number of times your baby kicks. If it is less than 5 times, get up, move around and drink some juice. Repeat the test 30 minutes later. If it is still less than 5 kicks in an hour, notify your doctor.

## 2020-06-10 NOTE — Discharge Summary (Signed)
See Final progress note

## 2020-06-10 NOTE — Final Progress Note (Signed)
Physician Final Progress Note  Patient ID: Adrienne Clarke MRN: [id number] DOB/AGE: November 24, 1986 34 y.o.  Admit date: 06/09/2020 Admitting provider: Will Bonnet, MD Discharge date: 06/10/2020   Admission Diagnoses:  1) abdominal pain in pregnancy, second trimester 2) intrauterine pregnancy at [redacted]w[redacted]d   Discharge Diagnoses:  1) abdominal pain in pregnancy, second trimester 2) intrauterine pregnancy at [redacted]w[redacted]d   History of Present Illness: The patient is a 34 y.o. female [phone] at [redacted]w[redacted]d who presents for abdominal pain. She states that pain started this morning at about 0730.  It started in her left lower quadrant and has now radiated to her lower mid-abdomen.  The pain has progressively gotten worse over the course of the day. She describes the pain as cramping. She rates the pain as 7/10.  She notes that walking makes it better. Nothing makes the pain worse. She has no associated symptoms. She specifically denies fevers, chills, nausea, new constipation. She denies blood in her stool. She denies urinary symptoms, blood in her urine, urinary frequency. She denies vaginal symptoms of itching, burning, irritation, abnormal discharge, abnormal odor to her discharge.  She denies trauma to her abdomen.  She notes +FM, no LOF, no vaginal bleeding. She denies contractions.    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   Current Outpatient Medications on File Prior to Encounter  Medication Sig Dispense Refill   folic acid (FOLVITE) 1 MG tablet Take 1 tablet (1 mg total) by mouth daily. 30 tablet 10   Multiple Vitamins-Minerals (MULTIVITAMIN WOMEN) TABS Take 1 tablet by mouth 1 day or 1 dose.       No Known Allergies  Social History   Socioeconomic History   Marital status: Significant Other    Spouse name: Kenyatta   Number of children: 3   Years of education: 12+   Highest education level: Not on file  Occupational History   Not on file  Tobacco Use   Smoking status: Never Smoker   Smokeless tobacco: Never Used  Vaping Use   Vaping Use: Never used  Substance and Sexual Activity   Alcohol use: No   Drug use: No   Sexual activity: Yes    Partners: Male    Birth control/protection: None  Other Topics Concern   Not on file  Social History Narrative   Not on file   Social Determinants of Health   Financial Resource Strain:    Difficulty of Paying Living Expenses:   Food Insecurity:    Worried About Charity fundraiser in the Last Year:    Arboriculturist in the Last Year:   Transportation Needs:    Film/video editor (Medical):    Lack of Transportation (Non-Medical):   Physical Activity:    Days of Exercise per Week:    Minutes of Exercise per Session:   Stress:    Feeling of Stress :   Social Connections:    Frequency of Communication with Friends and Family:    Frequency of Social Gatherings with Friends and Family:    Attends Religious Services:    Active Member of Clubs or Organizations:  Attends Archivist Meetings:    Marital Status:   Intimate Partner Violence:    Fear of Current or Ex-Partner:    Emotionally Abused:    Physically Abused:    Sexually Abused:     Family History  Problem Relation Age of Onset   AAA (abdominal aortic aneurysm) Maternal Grandfather      Review of Systems  Constitutional: Negative for chills, diaphoresis, fever, malaise/fatigue and weight loss.  HENT: Negative.   Eyes: Negative.   Respiratory: Negative.   Cardiovascular: Negative.   Gastrointestinal: Positive for abdominal pain and constipation. Negative for blood in stool, diarrhea, heartburn, nausea and  vomiting.  Genitourinary: Negative.  Negative for dysuria, flank pain, frequency, hematuria and urgency.  Musculoskeletal: Negative.   Skin: Negative.   Neurological: Negative.   Psychiatric/Behavioral: Negative.      Physical Exam: BP 111/64 (BP Location: Right Arm)    Pulse 86    Temp 98.8 F (37.1 C) (Oral)    Resp 18    Ht 5\' 5"  (1.651 m)    Wt 128.4 kg    LMP 12/28/2019 (Exact Date) Comment: abnormal   SpO2 100%    BMI 47.09 kg/m   Physical Exam Constitutional:      General: She is not in acute distress.    Appearance: Normal appearance. She is well-developed.  HENT:     Head: Normocephalic and atraumatic.  Eyes:     General: No scleral icterus.    Conjunctiva/sclera: Conjunctivae normal.  Cardiovascular:     Rate and Rhythm: Normal rate and regular rhythm.     Heart sounds: No murmur heard.  No friction rub. No gallop.   Pulmonary:     Effort: Pulmonary effort is normal. No respiratory distress.     Breath sounds: Normal breath sounds. No wheezing or rales.  Abdominal:     General: Bowel sounds are normal. There is no distension.     Palpations: Abdomen is soft.     Tenderness: There is abdominal tenderness. There is no guarding or rebound.     Comments: Gravid, tender to palpation, mildly, in her suprapubic region.  There is a small amount of pain in her deep right lower quadrant. The uterus is soft.  Musculoskeletal:        General: Normal range of motion.     Cervical back: Normal range of motion and neck supple.  Neurological:     General: No focal deficit present.     Mental Status: She is alert and oriented to person, place, and time.     Cranial Nerves: No cranial nerve deficit.  Skin:    General: Skin is warm and dry.     Findings: No erythema.  Psychiatric:        Mood and Affect: Mood normal.        Behavior: Behavior normal.        Judgment: Judgment normal.     Consults: None  Significant Findings/ Diagnostic Studies:  Lab Results  Component  Value Date   APPEARANCEUR CLEAR (A) 06/09/2020   GLUCOSEU NEGATIVE 06/09/2020   BILIRUBINUR NEGATIVE 06/09/2020   KETONESUR NEGATIVE 06/09/2020   LABSPEC 1.020 06/09/2020   HGBUR NEGATIVE 06/09/2020   PHURINE 5.0 06/09/2020   NITRITE NEGATIVE 06/09/2020   LEUKOCYTESUR NEGATIVE 06/09/2020   RBCU 0-5 06/09/2020   WBCU 0-5 06/09/2020   BACTERIA NONE SEEN 06/09/2020   EPIU 0-5 06/09/2020   MUCOUSUACOMP PRESENT 05/19/2013   CBC    Component Value Date/Time  WBC 8.4 06/09/2020 2332   RBC 4.01 06/09/2020 2332   HGB 11.3 (L) 06/09/2020 2332   HGB 12.0 02/09/2020 1233   HCT 33.8 (L) 06/09/2020 2332   HCT 36.6 02/09/2020 1233   PLT 202 06/09/2020 2332   PLT 203 02/09/2020 1233   MCV 84.3 06/09/2020 2332   MCV 85 02/09/2020 1233   MCV 88 02/09/2013 2145   MCH 28.2 06/09/2020 2332   MCHC 33.4 06/09/2020 2332   RDW 14.2 06/09/2020 2332   CMP Latest Ref Rng & Units 06/09/2020  Glucose 70 - 99 mg/dL 99  BUN 6 - 20 mg/dL 7  Creatinine 0.44 - 1.00 mg/dL 0.74  Sodium 135 - 145 mmol/L 136  Potassium 3.5 - 5.1 mmol/L 4.0  Chloride 98 - 111 mmol/L 103  CO2 22 - 32 mmol/L 26  Calcium 8.9 - 10.3 mg/dL 8.9  Total Protein 6.5 - 8.1 g/dL 6.7  Total Bilirubin 0.3 - 1.2 mg/dL 0.5  Alkaline Phos 38 - 126 U/L 50  AST 15 - 41 U/L 12(L)  ALT 0 - 44 U/L 12    Lipase     Component Value Date/Time   LIPASE 27 06/09/2020 2332     Procedures:  Fetal heart tones: normal  Hospital Course: The patient was admitted to Labor and Delivery Triage for observation. She had normal vital signs. Her exam is described above.  The fetal status was reassuring.  She voiced that she was worried about her cervix sine she had a LEEP procedure.  She was offered a pelvic exam to evaluated her cervix and she declined, even after voicing her concern.  Given her reassuring labs and non-specific exam, we discussed that she could have appendicitis, this could be related to any number of other things (ovarian cyst, though  less likely).  She was advised to take it easy, use tylenol, take a warm bath, using a heating pad on low heat.  She was also advised to return should she develop any new concerning symptoms. She voiced understanding and will keep her follow up in clinic.   Discharge Condition: stable  Disposition: Discharge disposition: 01-Home or Self Care       Diet: Regular diet  Discharge Activity: Activity as tolerated   Allergies as of 06/10/2020   No Known Allergies     Medication List    TAKE these medications   folic acid 1 MG tablet Commonly known as: FOLVITE Take 1 tablet (1 mg total) by mouth daily.   Multivitamin Women Tabs Take 1 tablet by mouth 1 day or 1 dose.        Total time spent taking care of this patient: 45 minutes  Signed: Prentice Docker, MD  06/10/2020, 12:44 AM

## 2020-06-15 ENCOUNTER — Other Ambulatory Visit: Payer: Self-pay

## 2020-06-15 ENCOUNTER — Encounter: Payer: Self-pay | Admitting: Obstetrics and Gynecology

## 2020-06-15 ENCOUNTER — Ambulatory Visit (INDEPENDENT_AMBULATORY_CARE_PROVIDER_SITE_OTHER): Payer: Medicaid Other

## 2020-06-15 ENCOUNTER — Ambulatory Visit (INDEPENDENT_AMBULATORY_CARE_PROVIDER_SITE_OTHER): Payer: Medicaid Other | Admitting: Obstetrics and Gynecology

## 2020-06-15 VITALS — BP 118/72 | Ht 65.0 in | Wt 286.8 lb

## 2020-06-15 DIAGNOSIS — M549 Dorsalgia, unspecified: Secondary | ICD-10-CM

## 2020-06-15 DIAGNOSIS — Z6841 Body Mass Index (BMI) 40.0 and over, adult: Secondary | ICD-10-CM

## 2020-06-15 DIAGNOSIS — O0990 Supervision of high risk pregnancy, unspecified, unspecified trimester: Secondary | ICD-10-CM

## 2020-06-15 DIAGNOSIS — O0992 Supervision of high risk pregnancy, unspecified, second trimester: Secondary | ICD-10-CM

## 2020-06-15 DIAGNOSIS — Z3A24 24 weeks gestation of pregnancy: Secondary | ICD-10-CM

## 2020-06-15 DIAGNOSIS — O99891 Other specified diseases and conditions complicating pregnancy: Secondary | ICD-10-CM

## 2020-06-15 DIAGNOSIS — O99212 Obesity complicating pregnancy, second trimester: Secondary | ICD-10-CM

## 2020-06-15 LAB — POCT URINALYSIS DIPSTICK OB
Glucose, UA: NEGATIVE
POC,PROTEIN,UA: NEGATIVE

## 2020-06-15 NOTE — Patient Instructions (Signed)

## 2020-06-15 NOTE — Progress Notes (Signed)
Routine Prenatal Care Visit  Subjective  Adrienne Clarke is a 34 y.o. [id number] at [redacted]w[redacted]d being seen today for ongoing prenatal care.  She is currently monitored for the following issues for this high-risk pregnancy and has Morbid obesity with BMI of 45.0-49.9, adult ([place]); History of LEEP (loop electrosurgical excision procedure) of cervix complicating pregnancy; Supervision of high risk pregnancy, antepartum; History of cervical dysplasia; Obesity affecting pregnancy in second trimester; and Abdominal pain during pregnancy in second trimester on their problem list.  ----------------------------------------------------------------------------------- Patient reports backache.   Contractions: Not present. Vag. Bleeding: None.  Movement: Present. Denies leaking of fluid.  ----------------------------------------------------------------------------------- The following portions of the patient's history were reviewed and updated as appropriate: allergies, current medications, past family history, past medical history, past social history, past surgical history and problem list. Problem list updated.   Objective  Blood pressure 118/72, height 5\' 5"  (1.651 m), weight 286 lb 12.8 oz (130.1 kg), last menstrual period 12/28/2019. Pregravid weight 275 lb (124.7 kg) Total Weight Gain 11 lb 12.8 oz (5.352 kg) Urinalysis:      Fetal Status: Fetal Heart Rate (bpm): 154   Movement: Present     General:  Alert, oriented and cooperative. Patient is in no acute distress.  Skin: Skin is warm and dry. No rash noted.   Cardiovascular: Normal heart rate noted  Respiratory: Normal respiratory effort, no problems with respiration noted  Abdomen: Soft, gravid, appropriate for gestational age. Pain/Pressure: Present     Pelvic:  Cervical exam deferred        Extremities: Normal range of motion.  Edema: None  Mental Status: Normal mood and affect. Normal behavior. Normal judgment and thought content.      Assessment   34 y.o. [id number] at [redacted]w[redacted]d by  10/03/2020, by Last Menstrual Period presenting for routine prenatal visit  Plan   FOURTH Problems (from 02/09/20 to present)    Problem Noted Resolved   Obesity affecting pregnancy in second trimester 03/23/2020 by Homero Fellers, MD No   History of LEEP (loop electrosurgical excision procedure) of cervix complicating pregnancy 5/36/6440 by Dalia Heading, CNM No   Overview Signed 02/10/2020  4:37 PM by Dalia Heading, CNM    Cervical length at 16 weeks      Supervision of high risk pregnancy, antepartum 02/10/2020 by Dalia Heading, CNM No   Overview Addendum 06/15/2020 11:09 AM by Homero Fellers, [place] Prenatal Labs  Dating By LMP=8w Blood type: O/Positive/-- (02/23 1233)   Genetic Screen  NIPS: normal xx Antibody:Negative (02/23 1233)  Anatomic [place] complete Rubella: 2.51 (02/23 1233) Varicella: Immune  GTT Early:  106 Third trimester:  RPR: Non Reactive (02/23 1233)   Rhogam  not needed HBsAg: Negative (02/23 1233)   Vaccines TDAP:                       Flu Shot: HIV: Non Reactive (02/23 1233)   Baby Food                                GBS:   Contraception  Pap: 2021 NIL  CBB     CS/VBAC    Support Person           Previous Version      Anesthesia referral  PT referral for back pain in pregnancy Discussed supportive care. Patient using belly binder.  Patient received COVID  vaccination   Gestational age appropriate obstetric precautions including but not limited to vaginal bleeding, contractions, leaking of fluid and fetal movement were reviewed in detail with the patient.    Return in about 4 weeks (around 07/13/2020) for ROB in person and 1 GTT.  Homero Fellers MD [facility] 06/15/2020, 11:20 AM

## 2020-07-12 ENCOUNTER — Other Ambulatory Visit: Payer: Self-pay

## 2020-07-12 ENCOUNTER — Encounter
Admission: RE | Admit: 2020-07-12 | Discharge: 2020-07-12 | Disposition: A | Discharge status: Home / Self Care | Payer: Medicaid Other | Source: Ambulatory Visit | Attending: Anesthesiology | Admitting: Anesthesiology

## 2020-07-12 NOTE — Consult Note (Signed)
[facility] Anesthesia Consultation  Adrienne Clarke EYC:[id number] DOB: 13-Mar-1986 DOA: 07/12/2020 PCP: Patient, No Pcp Per   Requesting physician: Dr. Gilman Schmidt Date of consultation: 07/12/20 Reason for consultation: Obesity during pregnancy  CHIEF COMPLAINT:  Obesity during pregnancy  HISTORY OF PRESENT ILLNESS: Adrienne Clarke  is a 34 y.o. female with a known history of obesity during pregnancy. She has had 3 prior vaginal deliveries all natural. Denies hx of cardiovascular disease. Denies hx of asthma. Denies personal or family hx of bleeding disorders.   PAST MEDICAL HISTORY:   Past Medical History:  Diagnosis Date  . Cervical dysplasia   . Obesity   . Vaginal Pap smear, abnormal     PAST SURGICAL HISTORY:  Past Surgical History:  Procedure Laterality Date  . 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 COLPOSCOPY      SOCIAL HISTORY:  Social History   Tobacco Use  . Smoking status: Never Smoker  . Smokeless tobacco: Never Used  Substance Use Topics  . Alcohol use: No    FAMILY HISTORY:  Family History  Problem Relation Age of Onset  . AAA (abdominal aortic aneurysm) Maternal Grandfather     DRUG ALLERGIES: No Known Allergies  REVIEW OF SYSTEMS:   RESPIRATORY: No cough, shortness of breath, wheezing.  CARDIOVASCULAR: No chest pain, orthopnea, edema.  HEMATOLOGY: No anemia, easy bruising or bleeding SKIN: No rash or lesion. NEUROLOGIC: No tingling, numbness, weakness.  PSYCHIATRY: No anxiety or depression.   MEDICATIONS AT HOME:  Prior to Admission medications   Medication Sig Start Date End Date Taking? Authorizing Provider  folic acid (FOLVITE) 1 MG tablet Take 1 tablet (1 mg total) by mouth daily. 02/09/20   Dalia Heading, CNM  Multiple Vitamins-Minerals (MULTIVITAMIN WOMEN) TABS Take 1 tablet by mouth 1 day or 1 dose.    [provider]      PHYSICAL  EXAMINATION:   VITAL SIGNS: Last menstrual period 12/28/2019.  GENERAL:  34 y.o.-year-old patient no acute distress.  HEENT: Head atraumatic, normocephalic. Oropharynx and nasopharynx clear. MP 2, TM distance >3 cm, normal mouth opening, grade 1 upper lip bite LUNGS: No use of accessory muscles of respiration.   EXTREMITIES: No pedal edema, cyanosis, or clubbing.  NEUROLOGIC: normal gait PSYCHIATRIC: The patient is alert and oriented x 3.  SKIN: No obvious rash, lesion, or ulcer.    IMPRESSION AND PLAN:   Adrienne Clarke  is a 34 y.o. female presenting with obesity during pregnancy. BMI is currently 47 at [redacted] weeks gestation.   Airway exam reassuring. Spinal interspaces minimally palpable.  Discussed spinal vs GA if cesarean delivery is required. Discussed increased risk of difficult intubation during pregnancy should an emergency cesarean delivery be required.   Plan [facility] at [facility].

## 2020-07-13 ENCOUNTER — Encounter: Payer: Self-pay | Admitting: Obstetrics & Gynecology

## 2020-07-13 ENCOUNTER — Ambulatory Visit (INDEPENDENT_AMBULATORY_CARE_PROVIDER_SITE_OTHER): Payer: Medicaid Other | Admitting: Obstetrics & Gynecology

## 2020-07-13 ENCOUNTER — Other Ambulatory Visit: Payer: Self-pay | Admitting: Obstetrics and Gynecology

## 2020-07-13 ENCOUNTER — Other Ambulatory Visit: Payer: Medicaid Other

## 2020-07-13 VITALS — BP 110/70 | Wt 288.0 lb

## 2020-07-13 DIAGNOSIS — Z3A28 28 weeks gestation of pregnancy: Secondary | ICD-10-CM

## 2020-07-13 DIAGNOSIS — O0993 Supervision of high risk pregnancy, unspecified, third trimester: Secondary | ICD-10-CM

## 2020-07-13 DIAGNOSIS — O99212 Obesity complicating pregnancy, second trimester: Secondary | ICD-10-CM

## 2020-07-13 DIAGNOSIS — O0990 Supervision of high risk pregnancy, unspecified, unspecified trimester: Secondary | ICD-10-CM

## 2020-07-13 DIAGNOSIS — O99213 Obesity complicating pregnancy, third trimester: Secondary | ICD-10-CM

## 2020-07-13 LAB — POCT URINALYSIS DIPSTICK OB
Glucose, UA: NEGATIVE
POC,PROTEIN,UA: NEGATIVE

## 2020-07-13 NOTE — Patient Instructions (Signed)
Third Trimester of Pregnancy The third trimester is from week 28 through week 40 (months 7 through 9). The third trimester is a time when the unborn baby (fetus) is growing rapidly. At the end of the ninth month, the fetus is about 20 inches in length and weighs 6-10 pounds. Body changes during your third trimester Your body will continue to go through many changes during pregnancy. The changes vary from woman to woman. During the third trimester:  Your weight will continue to increase. You can expect to gain 25-35 pounds (11-16 kg) by the end of the pregnancy.  You may begin to get stretch marks on your hips, abdomen, and breasts.  You may urinate more often because the fetus is moving lower into your pelvis and pressing on your bladder.  You may develop or continue to have heartburn. This is caused by increased hormones that slow down muscles in the digestive tract.  You may develop or continue to have constipation because increased hormones slow digestion and cause the muscles that push waste through your intestines to relax.  You may develop hemorrhoids. These are swollen veins (varicose veins) in the rectum that can itch or be painful.  You may develop swollen, bulging veins (varicose veins) in your legs.  You may have increased body aches in the pelvis, back, or thighs. This is due to weight gain and increased hormones that are relaxing your joints.  You may have changes in your hair. These can include thickening of your hair, rapid growth, and changes in texture. Some women also have hair loss during or after pregnancy, or hair that feels dry or thin. Your hair will most likely return to normal after your baby is born.  Your breasts will continue to grow and they will continue to become tender. A yellow fluid (colostrum) may leak from your breasts. This is the first milk you are producing for your baby.  Your belly button may stick out.  You may notice more swelling in your hands,  face, or ankles.  You may have increased tingling or numbness in your hands, arms, and legs. The skin on your belly may also feel numb.  You may feel short of breath because of your expanding uterus.  You may have more problems sleeping. This can be caused by the size of your belly, increased need to urinate, and an increase in your body's metabolism.  You may notice the fetus "dropping," or moving lower in your abdomen (lightening).  You may have increased vaginal discharge.  You may notice your joints feel loose and you may have pain around your pelvic bone. What to expect at prenatal visits You will have prenatal exams every 2 weeks until week 36. Then you will have weekly prenatal exams. During a routine prenatal visit:  You will be weighed to make sure you and the baby are growing normally.  Your blood pressure will be taken.  Your abdomen will be measured to track your baby's growth.  The fetal heartbeat will be listened to.  Any test results from the previous visit will be discussed.  You may have a cervical check near your due date to see if your cervix has softened or thinned (effaced).  You will be tested for Group B streptococcus. This happens between 35 and 37 weeks. Your health care provider may ask you:  What your birth plan is.  How you are feeling.  If you are feeling the baby move.  If you have had any abnormal   symptoms, such as leaking fluid, bleeding, severe headaches, or abdominal cramping.  If you are using any tobacco products, including cigarettes, chewing tobacco, and electronic cigarettes.  If you have any questions. Other tests or screenings that may be performed during your third trimester include:  Blood tests that check for low iron levels (anemia).  Fetal testing to check the health, activity level, and growth of the fetus. Testing is done if you have certain medical conditions or if there are problems during the pregnancy.  Nonstress test  (NST). This test checks the health of your baby to make sure there are no signs of problems, such as the baby not getting enough oxygen. During this test, a belt is placed around your belly. The baby is made to move, and its heart rate is monitored during movement. What is false labor? False labor is a condition in which you feel small, irregular tightenings of the muscles in the womb (contractions) that usually go away with rest, changing position, or drinking water. These are called Braxton Hicks contractions. Contractions may last for hours, days, or even weeks before true labor sets in. If contractions come at regular intervals, become more frequent, increase in intensity, or become painful, you should see your health care provider. What are the signs of labor?  Abdominal cramps.  Regular contractions that start at 10 minutes apart and become stronger and more frequent with time.  Contractions that start on the top of the uterus and spread down to the lower abdomen and back.  Increased pelvic pressure and dull back pain.  A watery or bloody mucus discharge that comes from the vagina.  Leaking of amniotic fluid. This is also known as your "water breaking." It could be a slow trickle or a gush. Let your health care provider know if it has a color or strange odor. If you have any of these signs, call your health care provider right away, even if it is before your due date. Follow these instructions at home: Medicines  Follow your health care provider's instructions regarding medicine use. Specific medicines may be either safe or unsafe to take during pregnancy.  Take a prenatal vitamin that contains at least 600 micrograms (mcg) of folic acid.  If you develop constipation, try taking a stool softener if your health care provider approves. Eating and drinking   Eat a balanced diet that includes fresh fruits and vegetables, whole grains, good sources of protein such as meat, eggs, or tofu,  and low-fat dairy. Your health care provider will help you determine the amount of weight gain that is right for you.  Avoid raw meat and uncooked cheese. These carry germs that can cause birth defects in the baby.  If you have low calcium intake from food, talk to your health care provider about whether you should take a daily calcium supplement.  Eat four or five small meals rather than three large meals a day.  Limit foods that are high in fat and processed sugars, such as fried and sweet foods.  To prevent constipation: ? Drink enough fluid to keep your urine clear or pale yellow. ? Eat foods that are high in fiber, such as fresh fruits and vegetables, whole grains, and beans. Activity  Exercise only as directed by your health care provider. Most women can continue their usual exercise routine during pregnancy. Try to exercise for 30 minutes at least 5 days a week. Stop exercising if you experience uterine contractions.  Avoid heavy lifting.  Do   not exercise in extreme heat or humidity, or at high altitudes.  Wear low-heel, comfortable shoes.  Practice good posture.  You may continue to have sex unless your health care provider tells you otherwise. Relieving pain and discomfort  Take frequent breaks and rest with your legs elevated if you have leg cramps or low back pain.  Take warm sitz baths to soothe any pain or discomfort caused by hemorrhoids. Use hemorrhoid cream if your health care provider approves.  Wear a good support bra to prevent discomfort from breast tenderness.  If you develop varicose veins: ? Wear support pantyhose or compression stockings as told by your healthcare provider. ? Elevate your feet for 15 minutes, 3-4 times a day. Prenatal care  Write down your questions. Take them to your prenatal visits.  Keep all your prenatal visits as told by your health care provider. This is important. Safety  Wear your seat belt at all times when driving.  Make  a list of emergency phone numbers, including numbers for family, friends, the hospital, and police and fire departments. General instructions  Avoid cat litter boxes and soil used by cats. These carry germs that can cause birth defects in the baby. If you have a cat, ask someone to clean the litter box for you.  Do not travel far distances unless it is absolutely necessary and only with the approval of your health care provider.  Do not use hot tubs, steam rooms, or saunas.  Do not drink alcohol.  Do not use any products that contain nicotine or tobacco, such as cigarettes and e-cigarettes. If you need help quitting, ask your health care provider.  Do not use any medicinal herbs or unprescribed drugs. These chemicals affect the formation and growth of the baby.  Do not douche or use tampons or scented sanitary pads.  Do not cross your legs for long periods of time.  To prepare for the arrival of your baby: ? Take prenatal classes to understand, practice, and ask questions about labor and delivery. ? Make a trial run to the hospital. ? Visit the hospital and tour the maternity area. ? Arrange for maternity or paternity leave through employers. ? Arrange for family and friends to take care of pets while you are in the hospital. ? Purchase a rear-facing car seat and make sure you know how to install it in your car. ? Pack your hospital bag. ? Prepare the baby's nursery. Make sure to remove all pillows and stuffed animals from the baby's crib to prevent suffocation.  Visit your dentist if you have not gone during your pregnancy. Use a soft toothbrush to brush your teeth and be gentle when you floss. Contact a health care provider if:  You are unsure if you are in labor or if your water has broken.  You become dizzy.  You have mild pelvic cramps, pelvic pressure, or nagging pain in your abdominal area.  You have lower back pain.  You have persistent nausea, vomiting, or  diarrhea.  You have an unusual or bad smelling vaginal discharge.  You have pain when you urinate. Get help right away if:  Your water breaks before 37 weeks.  You have regular contractions less than 5 minutes apart before 37 weeks.  You have a fever.  You are leaking fluid from your vagina.  You have spotting or bleeding from your vagina.  You have severe abdominal pain or cramping.  You have rapid weight loss or weight gain.  You have   shortness of breath with chest pain.  You notice sudden or extreme swelling of your face, hands, ankles, feet, or legs.  Your baby makes fewer than 10 movements in 2 hours.  You have severe headaches that do not go away when you take medicine.  You have vision changes. Summary  The third trimester is from week 28 through week 40, months 7 through 9. The third trimester is a time when the unborn baby (fetus) is growing rapidly.  During the third trimester, your discomfort may increase as you and your baby continue to gain weight. You may have abdominal, leg, and back pain, sleeping problems, and an increased need to urinate.  During the third trimester your breasts will keep growing and they will continue to become tender. A yellow fluid (colostrum) may leak from your breasts. This is the first milk you are producing for your baby.  False labor is a condition in which you feel small, irregular tightenings of the muscles in the womb (contractions) that eventually go away. These are called Braxton Hicks contractions. Contractions may last for hours, days, or even weeks before true labor sets in.  Signs of labor can include: abdominal cramps; regular contractions that start at 10 minutes apart and become stronger and more frequent with time; watery or bloody mucus discharge that comes from the vagina; increased pelvic pressure and dull back pain; and leaking of amniotic fluid. This information is not intended to replace advice given to you by your  health care provider. Make sure you discuss any questions you have with your health care provider. Document Revised: 03/26/2019 Document Reviewed: 01/08/2017 Elsevier Patient Education  2020 [employer]  

## 2020-07-13 NOTE — Progress Notes (Signed)
  Subjective  Fetal Movement? yes Contractions? no Leaking Fluid? no Vaginal Bleeding? no Back pain improved w maternity belt Objective  BP 110/70   Wt (!) 288 lb (130.6 kg)   LMP 12/28/2019 (Exact Date) Comment: abnormal  BMI 47.93 kg/m  General: NAD Pumonary: no increased work of breathing Abdomen: gravid, non-tender Extremities: no edema Psychiatric: mood appropriate, affect full  Assessment  34 y.o. [id number] at 107w2d by  10/03/2020, by Last Menstrual Period presenting for routine prenatal visit  Plan   Problem List Items Addressed This Visit      Other   Supervision of high risk pregnancy, antepartum   Obesity affecting pregnancy in second trimester    Other Visit Diagnoses    [redacted] weeks gestation of pregnancy    -  Primary      FOURTH Problems (from 02/09/20 to present)    Problem Noted Resolved   Obesity affecting pregnancy in second trimester 03/23/2020 by Homero Fellers, MD No   History of LEEP (loop electrosurgical excision procedure) of cervix complicating pregnancy 07/26/1750 by Dalia Heading, CNM No   Overview Signed 02/10/2020  4:37 PM by Dalia Heading, CNM    Cervical length at 16 weeks      Supervision of high risk pregnancy, antepartum 02/10/2020 by Dalia Heading, CNM No   Overview Addendum 07/13/2020 10:08 AM by Gae Dry, MD    [facility]inic [facility] Prenatal Labs  Dating By LMP=8w Blood type: O/Positive/-- (02/23 1233)   Genetic Screen  NIPS: normal xx Antibody:Negative (02/23 1233)  Anatomic [place] complete Rubella: 2.51 (02/23 1233) Varicella: Immune  GTT Early:  106 Third trimester:  RPR: Non Reactive (02/23 1233)   Rhogam  not needed HBsAg: Negative (02/23 1233)   Vaccines TDAP:                       Flu Shot: HIV: Non Reactive (02/23 1233)   Baby Food    Breast- breastfed for 8 mos last pregnancy     GBS:   Contraception unsure Pap: 2021 NIL  CBB  no   CS n/a Patient has received covid vaccination   Support Person            Previous Version     PNV, [place] today  Anes consult approved for [place] delivery, risks discussed of obesity and labor/delivery  Barnett Applebaum, MD, [name], [facility] 07/13/2020  10:08 AM

## 2020-07-13 NOTE — Addendum Note (Signed)
Addended by: Quintella Baton D on: 07/13/2020 10:11 AM   Modules accepted: Orders

## 2020-07-14 ENCOUNTER — Encounter: Payer: Self-pay | Admitting: Physical Therapy

## 2020-07-14 ENCOUNTER — Other Ambulatory Visit: Payer: Self-pay

## 2020-07-14 ENCOUNTER — Ambulatory Visit: Payer: Medicaid Other | Attending: Obstetrics and Gynecology | Admitting: Physical Therapy

## 2020-07-14 DIAGNOSIS — R2689 Other abnormalities of gait and mobility: Secondary | ICD-10-CM | POA: Diagnosis present

## 2020-07-14 DIAGNOSIS — M6281 Muscle weakness (generalized): Secondary | ICD-10-CM

## 2020-07-14 DIAGNOSIS — M533 Sacrococcygeal disorders, not elsewhere classified: Secondary | ICD-10-CM | POA: Insufficient documentation

## 2020-07-14 LAB — 28 WEEK RH+PANEL
Basophils Absolute: 0 10*3/uL (ref 0.0–0.2)
Basos: 0 %
EOS (ABSOLUTE): 0.1 10*3/uL (ref 0.0–0.4)
Eos: 1 %
Gestational Diabetes Screen: 118 mg/dL (ref 65–139)
HIV Screen 4th Generation wRfx: NONREACTIVE
Hematocrit: 34.1 % (ref 34.0–46.6)
Hemoglobin: 10.8 g/dL — ABNORMAL LOW (ref 11.1–15.9)
Immature Grans (Abs): 0 10*3/uL (ref 0.0–0.1)
Immature Granulocytes: 0 %
Lymphocytes Absolute: 1 10*3/uL (ref 0.7–3.1)
Lymphs: 17 %
MCH: 26.9 pg (ref 26.6–33.0)
MCHC: 31.7 g/dL (ref 31.5–35.7)
MCV: 85 fL (ref 79–97)
Monocytes Absolute: 0.4 10*3/uL (ref 0.1–0.9)
Monocytes: 6 %
Neutrophils Absolute: 4.5 10*3/uL (ref 1.4–7.0)
Neutrophils: 76 %
Platelets: 205 10*3/uL (ref 150–450)
RBC: 4.02 x10E6/uL (ref 3.77–5.28)
RDW: 12.9 % (ref 11.7–15.4)
RPR Ser Ql: NONREACTIVE
WBC: 6 10*3/uL (ref 3.4–10.8)

## 2020-07-14 NOTE — Patient Instructions (Addendum)
Open book  ( handout)      Clam Shell 45 Degrees   Lying with hips and knees bent 45, one pillow between knees and ankles. Lift knee with exhale. Be sure pelvis does not roll backward. Do not arch back. Do 20 times, L , 2 times per day.      Complimentary stretch: Figure-4  Seated  5 reps  ___  Standing with equal weight bearing on both feet with knee unlocked  focus on the ballmounds of the feet    Do not prop leg on cart   Switch between feet hip width apart , knees unlocked Or ski tracks    ___   Getting into bed on your side first

## 2020-07-15 NOTE — Therapy (Addendum)
[place] [facility] SERVICES [address] [place], [place], [address] Phone: [phone]   Fax:  [phone]  Physical Therapy Evaluation  Patient Details  Name: Adrienne Clarke MRN: [id number] Date of Birth: 05/24/1986 Referring Provider (PT): Schuman   Encounter Date: 07/14/2020   PT End of Session - 07/14/20 1703    Visit Number 1    Number of Visits 10   Date for PT Re-Evaluation .09/23/2020     PT Start Time 1607    PT Stop Time 1700    PT Time Calculation (min) 53 min           Past Medical History:  Diagnosis Date  . Cervical dysplasia   . Obesity   . Vaginal Pap smear, abnormal     Past Surgical History:  Procedure Laterality Date  . 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 COLPOSCOPY      There were no vitals filed for this visit.    Subjective Assessment - 07/14/20 1613    Subjective 1) pressure pelvic sensation:  Pt is [redacted] weeks pregnant with her 4th child. Pt  experienced pelvic pain with a sensation like "everything will fall out" which started end of June during the trimester. Pt is wearing a belt which is helping with support. There is a lot of pressure sensation. Pt is able to empty her bladder. Pt went to the ER for this pain and pt was told her lab work and cervix and baby's heart rate were all good.  Pelvic pain level can be 4-5/10 with walking and being on her feet.    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 It is located in the lower back in the middle without radiating pain down the leg.  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   3) SUI with sneezing and laughing     Pertinent History 3 vaginal deliveries with stitches.              [facility] PT Assessment - 07/15/20 1124      Assessment   Medical Diagnosis pain during pregnancy     Referring Provider (PT) Schuman      Precautions   Precautions None      Restrictions   Weight  Bearing Restrictions No      Balance Screen   Has the patient fallen in the past 6 months No      Strength   Overall Strength Comments L LE 4/5, R 5/5  hip flexion, knee flexion/ext, L abduction 3/5, R 5/5        Palpation   SI assessment  L iliacccrest , R shoulder higher ( post Tx: levelled )       Ambulation/Gait   Gait Comments L hip higher, more R sideflexion                      Objective measurements completed on examination: See above findings.       [facility] PT Treatment/Exercise - 07/15/20 1122      Ambulation/Gait   Gait Comments L hip higher, more R sideflexion      Therapeutic Activites    Therapeutic Activities --   cued for body mechanics at work      Neuro Re-ed    Neuro Re-ed Details  cued for HEP       Manual  Therapy   Manual therapy comments superior mob/ rotational mob at [facility], thoracic to cervical segments                        PT Long Term Goals - 07/15/20 1116      PT LONG TERM GOAL #1   Title Pt will decrease FOTO score from 65 pts to < 55 pts in order to improve QOL    Baseline 65 pts    Time 10    Period Weeks    Status New    Target Date 09/23/20      PT LONG TERM GOAL #2   Title Pt will demo no spinal deviations and more levelled shoulder and pelvis across 2 visits in order to progress to core and back strengethening exericses.    Baseline R shoulder/ L iliac crest higher    Time 4    Period Weeks    Status New    Target Date 08/12/20      PT LONG TERM GOAL #3   Title Pt demo IND with HEP with proper coordination of deep core coordination/ strength , hip strengthening exericses to have less pain during her pregnancy    Baseline not IND with limited understanding on proper exercises    Time 6    Period Weeks    Status New    Target Date 08/26/20      PT LONG TERM GOAL #4   Title Pt will report compliance with proper body mechanics at work with equal weight bearing on BLE and not propping leg on cart  at her work as a Corporate treasurer to maintain proper spinal and pelvic alignment    Time 8    Period Weeks    Status New    Target Date 09/09/20      PT LONG TERM GOAL #5   Title Pt will report decreased SUI  and pressure sensation by 50% in order to improve pelvic support and QOL    Baseline SUI and pressure sensation in pelvic area present    Time 10    Period Weeks    Status New    Target Date 09/23/20                  Plan - 07/14/20 1704    Clinical Impression Statement  Pt is a 34 yo old patient who presents with pressure sensation in pelvic area, CLBP, and SUI which impacts her QOL. Pt is [redacted] weeks pregnant with her 4th child and has Hx of vaginal deliveries with past children that involved perineal stitches.  Pt's musculoskeletal assessment revealed uneven shoulder and iliac crest height, hip weakness, limited spinal /pelvic mobility, dyscoordination and strength of pelvic floor mm, and poor body mechanics which places strain on the abdominal/pelvic floor mm.   These are deficits that indicate an ineffective intraabdominal pressure system associated with increased risk for pt's Sx.   Pt was provided education on etiology of Sx with anatomy, physiology explanation with images along with the benefits of customized pelvic PT Tx based on pt's medical conditions and musculoskeletal deficits.  Explained the physiology of deep core mm coordination and roles of pelvic floor function in urination, defecation, sexual function, and postural control with deep core mm system.   Following Tx today which pt tolerated without complaints, pt demo'd equal alignment of pelvic girdle and increased spinal mobility with report of less CLBP. Plan to initiate deep core coordination and strengthening at  next session.      Stability/Clinical Decision Making Stable/Uncomplicated    Clinical Decision Making Moderate    Rehab Potential Good    PT Frequency 1x / week    PT Duration Other (comment)   10   PT  Treatment/Interventions Gait training;Neuromuscular re-education;Stair training;Moist Heat;Therapeutic activities;Therapeutic exercise;Patient/family education;Manual techniques;Taping;Energy conservation;Scar mobilization;Cryotherapy    Consulted and Agree with Plan of Care Patient           Patient will benefit from skilled therapeutic intervention in order to improve the following deficits and impairments:  Decreased endurance, Decreased activity tolerance, Decreased strength, Decreased mobility, Decreased coordination, Abnormal gait, Increased muscle spasms, Hypermobility, Improper body mechanics, Pain, Postural dysfunction, Hypomobility, Difficulty walking  Visit Diagnosis: Sacrococcygeal disorders, not elsewhere classified  Other abnormalities of gait and mobility  Muscle weakness (generalized)     Problem List Patient Active Problem List   Diagnosis Date Noted  . Abdominal pain during pregnancy in second trimester 06/09/2020  . Obesity affecting pregnancy in second trimester 03/23/2020  . History of LEEP (loop electrosurgical excision procedure) of cervix complicating pregnancy 84/13/2440  . Supervision of high risk pregnancy, antepartum 02/10/2020  . History of cervical dysplasia 02/10/2020  . Morbid obesity with BMI of 45.0-49.9, adult ([place]) 07/14/2019    Jerl Mina ,PT, DPT, E-RYT  07/15/2020, 11:28 AM  [place] [facility] SERVICES [address] [place], [place], [address] Phone: [phone]   Fax:  [phone]  Name: AUDI CONOVER MRN: [id number] Date of Birth: Jan 19, 1986

## 2020-07-15 NOTE — Addendum Note (Signed)
Addended by: Jerl Mina on: 07/15/2020 11:37 AM   Modules accepted: Orders

## 2020-07-21 ENCOUNTER — Ambulatory Visit: Payer: Medicaid Other | Attending: Obstetrics and Gynecology | Admitting: Physical Therapy

## 2020-07-27 ENCOUNTER — Other Ambulatory Visit: Payer: Self-pay

## 2020-07-27 ENCOUNTER — Ambulatory Visit (INDEPENDENT_AMBULATORY_CARE_PROVIDER_SITE_OTHER): Payer: Medicaid Other | Admitting: Advanced Practice Midwife

## 2020-07-27 ENCOUNTER — Ambulatory Visit: Payer: Medicaid Other | Admitting: Physical Therapy

## 2020-07-27 ENCOUNTER — Encounter: Payer: Self-pay | Admitting: Advanced Practice Midwife

## 2020-07-27 VITALS — BP 118/70 | Ht 65.0 in | Wt 288.0 lb

## 2020-07-27 DIAGNOSIS — Z3A30 30 weeks gestation of pregnancy: Secondary | ICD-10-CM

## 2020-07-27 DIAGNOSIS — Z23 Encounter for immunization: Secondary | ICD-10-CM | POA: Diagnosis not present

## 2020-07-27 DIAGNOSIS — O0993 Supervision of high risk pregnancy, unspecified, third trimester: Secondary | ICD-10-CM

## 2020-07-27 DIAGNOSIS — O99213 Obesity complicating pregnancy, third trimester: Secondary | ICD-10-CM

## 2020-07-27 LAB — POCT URINALYSIS DIPSTICK OB
Glucose, UA: NEGATIVE
POC,PROTEIN,UA: NEGATIVE

## 2020-07-27 NOTE — Progress Notes (Signed)
Routine Prenatal Care Visit  Subjective  Adrienne Clarke is a 34 y.o. G4P3003 at [redacted]w[redacted]d being seen today for ongoing prenatal care.  She is currently monitored for the following issues for this high-risk pregnancy and has Morbid obesity with BMI of 45.0-49.9, adult ([place]); History of LEEP (loop electrosurgical excision procedure) of cervix complicating pregnancy; Supervision of high risk pregnancy, antepartum; History of cervical dysplasia; Obesity affecting pregnancy in second trimester; and Abdominal pain during pregnancy in second trimester on their problem list.  ----------------------------------------------------------------------------------- Patient reports leg cramps.   Contractions: Not present. Vag. Bleeding: None.  Movement: Present. Leaking Fluid denies.  ----------------------------------------------------------------------------------- The following portions of the patient's history were reviewed and updated as appropriate: allergies, current medications, past family history, past medical history, past social history, past surgical history and problem list. Problem list updated.  Objective  Blood pressure 118/70, height 5\' 5"  (1.651 m), weight 288 lb (130.6 kg), last menstrual period 12/28/2019. Pregravid weight 275 lb (124.7 kg) Total Weight Gain 13 lb (5.897 kg) Urinalysis: Urine Protein    Urine Glucose    Fetal Status: Fetal Heart Rate (bpm): 147 Fundal Height: 31 cm Movement: Present     General:  Alert, oriented and cooperative. Patient is in no acute distress.  Skin: Skin is warm and dry. No rash noted.   Cardiovascular: Normal heart rate noted  Respiratory: Normal respiratory effort, no problems with respiration noted  Abdomen: Soft, gravid, appropriate for gestational age. Pain/Pressure: Absent     Pelvic:  Cervical exam deferred        Extremities: Normal range of motion.  Edema: None  Mental Status: Normal mood and affect. Normal behavior. Normal judgment and  thought content.   Assessment   34 y.o. G4P3003 at [redacted]w[redacted]d by  10/03/2020, by Last Menstrual Period presenting for routine prenatal visit  Plan   FOURTH Problems (from 02/09/20 to present)    Problem Noted Resolved   Obesity affecting pregnancy in second trimester 03/23/2020 by Homero Fellers, MD No   History of LEEP (loop electrosurgical excision procedure) of cervix complicating pregnancy 01/13/7866 by Dalia Heading, CNM No   Overview Signed 02/10/2020  4:37 PM by Dalia Heading, CNM    Cervical length at 16 weeks      Supervision of high risk pregnancy, antepartum 02/10/2020 by Dalia Heading, CNM No   Overview Addendum 07/27/2020  3:26 PM by Rod Can, [place] Prenatal Labs  Dating By LMP=8w Blood type: O/Positive/-- (02/23 1233)   Genetic Screen  NIPS: normal xx Antibody:Negative (02/23 1233)  Anatomic [place] complete Rubella: 2.51 (02/23 1233) Varicella: Immune  GTT Early:  106 Third trimester:  RPR: Non Reactive (02/23 1233)   Rhogam  not needed HBsAg: Negative (02/23 1233)   Vaccines TDAP:  07/27/20                     Flu Shot: HIV: Non Reactive (02/23 1233)   Baby Food Breast- breastfed for 8 mos last pregnancy     GBS:   Contraception unsure Pap: 2021 NIL  CBB  no   CS n/a Patient has received covid vaccination   Support Person           Previous Version    Leg cramps: increase hydration, OTC magnesium supplement 250 mg, epsom salt soaks   Preterm labor symptoms and general obstetric precautions including but not limited to vaginal bleeding, contractions, leaking of fluid and fetal movement were reviewed in detail with  the patient.    Return for scheduled growth scan and rob  Rod Can, [facility] 07/27/2020 3:34 PM

## 2020-08-09 ENCOUNTER — Ambulatory Visit: Payer: Medicaid Other | Admitting: Physical Therapy

## 2020-08-10 ENCOUNTER — Ambulatory Visit (INDEPENDENT_AMBULATORY_CARE_PROVIDER_SITE_OTHER): Payer: Medicaid Other | Admitting: Obstetrics

## 2020-08-10 ENCOUNTER — Ambulatory Visit (INDEPENDENT_AMBULATORY_CARE_PROVIDER_SITE_OTHER): Payer: Medicaid Other

## 2020-08-10 ENCOUNTER — Other Ambulatory Visit: Payer: Self-pay

## 2020-08-10 VITALS — BP 128/70 | Wt 286.9 lb

## 2020-08-10 DIAGNOSIS — Z3A33 33 weeks gestation of pregnancy: Secondary | ICD-10-CM

## 2020-08-10 DIAGNOSIS — O0993 Supervision of high risk pregnancy, unspecified, third trimester: Secondary | ICD-10-CM

## 2020-08-10 DIAGNOSIS — Z3A32 32 weeks gestation of pregnancy: Secondary | ICD-10-CM

## 2020-08-10 DIAGNOSIS — O99213 Obesity complicating pregnancy, third trimester: Secondary | ICD-10-CM

## 2020-08-10 LAB — POCT URINALYSIS DIPSTICK OB
Glucose, UA: NEGATIVE
POC,PROTEIN,UA: NEGATIVE

## 2020-08-10 NOTE — Progress Notes (Signed)
Routine Prenatal Care Visit  Subjective  Adrienne Clarke is a 34 y.o. G4P3003 at [redacted]w[redacted]d being seen today for ongoing prenatal care.  She is currently monitored for the following issues for this high-risk pregnancy and has Morbid obesity with BMI of 45.0-49.9, adult ([place]); History of LEEP (loop electrosurgical excision procedure) of cervix complicating pregnancy; Supervision of high risk pregnancy, antepartum; History of cervical dysplasia; Obesity affecting pregnancy in second trimester; and Abdominal pain during pregnancy in second trimester on their problem list.  ----------------------------------------------------------------------------------- Patient reports no complaints.   Contractions: Not present.  .  Movement: Present. Leaking Fluid denies.  ----------------------------------------------------------------------------------- The following portions of the patient's history were reviewed and updated as appropriate: allergies, current medications, past family history, past medical history, past social history, past surgical history and problem list. Problem list updated.  Objective  Blood pressure 128/70, weight 286 lb 14.4 oz (130.1 kg), last menstrual period 12/28/2019. Pregravid weight 275 lb (124.7 kg) Total Weight Gain 11 lb 14.4 oz (5.398 kg) Urinalysis: Urine Protein    Urine Glucose    Fetal Status:     Movement: Present     General:  Alert, oriented and cooperative. Patient is in no acute distress.  Skin: Skin is warm and dry. No rash noted.   Cardiovascular: Normal heart rate noted  Respiratory: Normal respiratory effort, no problems with respiration noted  Abdomen: Soft, gravid, appropriate for gestational age. Pain/Pressure: Absent     Pelvic:  Cervical exam deferred        Extremities: Normal range of motion.     Mental Status: Normal mood and affect. Normal behavior. Normal judgment and thought content.   Assessment   34 y.o. G4P3003 at [redacted]w[redacted]d by  10/03/2020, by Last  Menstrual Period presenting for routine prenatal visit  Plan   FOURTH Problems (from 02/09/20 to present)    Problem Noted Resolved   Obesity affecting pregnancy in second trimester 03/23/2020 by Homero Fellers, MD No   History of LEEP (loop electrosurgical excision procedure) of cervix complicating pregnancy 6/37/8588 by Dalia Heading, CNM No   Overview Signed 02/10/2020  4:37 PM by Dalia Heading, CNM    Cervical length at 16 weeks      Supervision of high risk pregnancy, antepartum 02/10/2020 by Dalia Heading, CNM No   Overview Addendum 07/27/2020  3:26 PM by Rod Can, [place] Prenatal Labs  Dating By LMP=8w Blood type: O/Positive/-- (02/23 1233)   Genetic Screen  NIPS: normal xx Antibody:Negative (02/23 1233)  Anatomic [place] complete Rubella: 2.51 (02/23 1233) Varicella: Immune  GTT Early:  106 Third trimester:  RPR: Non Reactive (02/23 1233)   Rhogam  not needed HBsAg: Negative (02/23 1233)   Vaccines TDAP:  07/27/20                     Flu Shot: HIV: Non Reactive (02/23 1233)   Baby Food Breast- breastfed for 8 mos last pregnancy     GBS:   Contraception unsure Pap: 2021 NIL  CBB  no   CS n/a Patient has received covid vaccination   Support Person           Previous Version     Sono- Growth at 51% and AC at 16.3%  Preterm labor symptoms and general obstetric precautions including but not limited to vaginal bleeding, contractions, leaking of fluid and fetal movement were reviewed in detail with the patient. Please refer to After Visit Summary for other counseling  recommendations.  Growth ultrasound reviewed.  Return in about 2 weeks (around 08/24/2020) for return OB.  [name], CNM  08/10/2020 10:16 AM

## 2020-08-18 ENCOUNTER — Ambulatory Visit: Payer: Medicaid Other | Attending: Obstetrics and Gynecology | Admitting: Physical Therapy

## 2020-08-25 ENCOUNTER — Encounter: Payer: Medicaid Other | Admitting: Physical Therapy

## 2020-08-26 ENCOUNTER — Other Ambulatory Visit: Payer: Self-pay

## 2020-08-26 ENCOUNTER — Ambulatory Visit (INDEPENDENT_AMBULATORY_CARE_PROVIDER_SITE_OTHER): Payer: Medicaid Other | Admitting: Obstetrics

## 2020-08-26 VITALS — BP 122/80 | Wt 282.0 lb

## 2020-08-26 DIAGNOSIS — Z3A34 34 weeks gestation of pregnancy: Secondary | ICD-10-CM

## 2020-08-26 DIAGNOSIS — O0993 Supervision of high risk pregnancy, unspecified, third trimester: Secondary | ICD-10-CM

## 2020-08-26 LAB — POCT URINALYSIS DIPSTICK OB: Glucose, UA: NEGATIVE

## 2020-08-26 NOTE — Progress Notes (Signed)
Routine Prenatal Care Visit  Subjective  Adrienne Clarke is a 34 y.o. G4P3003 at [redacted]w[redacted]d being seen today for ongoing prenatal care.  She is currently monitored for the following issues for this high-risk pregnancy and has Morbid obesity with BMI of 45.0-49.9, adult ([place]); History of LEEP (loop electrosurgical excision procedure) of cervix complicating pregnancy; Supervision of high risk pregnancy, antepartum; History of cervical dysplasia; Obesity affecting pregnancy in second trimester; and Abdominal pain during pregnancy in second trimester on their problem list.  ----------------------------------------------------------------------------------- Patient reports no bleeding, no contractions, no cramping and no leaking.    .  .   Adrienne Clarke Fluid denies.  ----------------------------------------------------------------------------------- The following portions of the patient's history were reviewed and updated as appropriate: allergies, current medications, past family history, past medical history, past social history, past surgical history and problem list. Problem list updated.  Objective  Blood pressure 122/80, weight 282 lb (127.9 kg), last menstrual period 12/28/2019. Pregravid weight 275 lb (124.7 kg) Total Weight Gain 7 lb (3.175 kg) Urinalysis: Urine Protein    Urine Glucose    Fetal Status:           General:  Alert, oriented and cooperative. Patient is in no acute distress.  Skin: Skin is warm and dry. No rash noted.   Cardiovascular: Normal heart rate noted  Respiratory: Normal respiratory effort, no problems with respiration noted  Abdomen: Soft, gravid, appropriate for gestational age.       Pelvic:  Cervical exam deferred        Extremities: Normal range of motion.  Edema: None  Mental Status: Normal mood and affect. Normal behavior. Normal judgment and thought content.   Assessment   34 y.o. [id number] at 107w4d by  10/03/2020, by Last Menstrual Period presenting for routine  prenatal visit  Plan   FOURTH Problems (from 02/09/20 to present)    Problem Noted Resolved   Obesity affecting pregnancy in second trimester 03/23/2020 by Homero Fellers, MD No   History of LEEP (loop electrosurgical excision procedure) of cervix complicating pregnancy 9/45/0388 by Dalia Heading, CNM No   Overview Signed 02/10/2020  4:37 PM by Dalia Heading, CNM    Cervical length at 16 weeks      Supervision of high risk pregnancy, antepartum 02/10/2020 by Dalia Heading, CNM No   Overview Addendum 07/27/2020  3:26 PM by Rod Can, [place] Prenatal Labs  Dating By LMP=8w Blood type: O/Positive/-- (02/23 1233)   Genetic Screen  NIPS: normal xx Antibody:Negative (02/23 1233)  Anatomic [place] complete Rubella: 2.51 (02/23 1233) Varicella: Immune  GTT Early:  106 Third trimester:  RPR: Non Reactive (02/23 1233)   Rhogam  not needed HBsAg: Negative (02/23 1233)   Vaccines TDAP:  07/27/20                     Flu Shot: HIV: Non Reactive (02/23 1233)   Baby Food Breast- breastfed for 8 mos last pregnancy     GBS:   Contraception unsure Pap: 2021 NIL  CBB  no   CS n/a Patient has received covid vaccination   Support Person           Previous Version       Term labor symptoms and general obstetric precautions including but not limited to vaginal bleeding, contractions, leaking of fluid and fetal movement were reviewed in detail with the patient. Please refer to After Visit Summary for other counseling recommendations.  We discussed contraception  post delivery. She is leaning towards a Nexplanon. Discussed the benefits and the risks with this method. jShe has been cleared by anethesia for an epidural, although she wants to "go natural" Also discussed GBS testing next visit.  Return in about 2 weeks (around 09/09/2020) for return OB and GBS, .  Adrienne Clarke, CNM  08/26/2020 8:29 AM

## 2020-08-26 NOTE — Progress Notes (Signed)
No concerns.rj 

## 2020-08-30 ENCOUNTER — Encounter: Payer: Medicaid Other | Admitting: Physical Therapy

## 2020-09-08 ENCOUNTER — Encounter: Payer: Medicaid Other | Admitting: Physical Therapy

## 2020-09-09 ENCOUNTER — Ambulatory Visit (INDEPENDENT_AMBULATORY_CARE_PROVIDER_SITE_OTHER): Payer: Medicaid Other | Admitting: Obstetrics & Gynecology

## 2020-09-09 ENCOUNTER — Other Ambulatory Visit: Payer: Self-pay

## 2020-09-09 ENCOUNTER — Encounter: Payer: Self-pay | Admitting: Obstetrics & Gynecology

## 2020-09-09 VITALS — BP 120/80 | Wt 283.0 lb

## 2020-09-09 DIAGNOSIS — Z3685 Encounter for antenatal screening for Streptococcus B: Secondary | ICD-10-CM

## 2020-09-09 DIAGNOSIS — Z3A36 36 weeks gestation of pregnancy: Secondary | ICD-10-CM

## 2020-09-09 DIAGNOSIS — O99213 Obesity complicating pregnancy, third trimester: Secondary | ICD-10-CM

## 2020-09-09 DIAGNOSIS — O0993 Supervision of high risk pregnancy, unspecified, third trimester: Secondary | ICD-10-CM

## 2020-09-09 DIAGNOSIS — O0990 Supervision of high risk pregnancy, unspecified, unspecified trimester: Secondary | ICD-10-CM

## 2020-09-09 NOTE — Patient Instructions (Signed)

## 2020-09-09 NOTE — Progress Notes (Signed)
°  Subjective  Fetal Movement? yes Contractions? no Leaking Fluid? no Vaginal Bleeding? no  Objective  BP 120/80    Wt 283 lb (128.4 kg)    LMP 12/28/2019 (Exact Date) Comment: abnormal   BMI 47.09 kg/m  General: NAD Pumonary: no increased work of breathing Abdomen: gravid, non-tender Extremities: no edema Psychiatric: mood appropriate, affect full  Assessment  34 y.o. [id number] at [redacted]w[redacted]d by  10/03/2020, by Last Menstrual Period presenting for routine prenatal visit  Plan   Problem List Items Addressed This Visit      Other   Supervision of high risk pregnancy, antepartum    Obesity    Baby ASA, Anes consult (done)    Other Visit Diagnoses    Encounter for antenatal screening for Streptococcus B      Relevant Orders   Culture, beta strep (group b only)   [redacted] weeks gestation of pregnancy        PNV, [facility]    Labor precautions discussed    Contraception discussed, leaning towards Mirena IUD    Supervision of high risk pregnancy in third trimester      APT weekly (AFI, NST)    IOL 41 weeks         [place] Prenatal Labs  Dating By LMP=8w Blood type: O/Positive/-- (02/23 1233)   Genetic Screen  NIPS: normal xx Antibody:Negative (02/23 1233)  Anatomic [place] complete Rubella: 2.51 (02/23 1233) Varicella: Immune  GTT Early:  106 Third trimester:  RPR: Non Reactive (02/23 1233)   Rhogam  not needed HBsAg: Negative (02/23 1233)   Vaccines TDAP:  07/27/20                     Flu Shot: HIV: Non Reactive (02/23 1233)   Baby Food Breast- breastfed for 8 mos last pregnancy     GBS: TODAY  Contraception Nexplanon or Mirena Pap: 2021 NIL  CBB  no   CS n/a Patient has received covid vaccination   Support Person             Barnett Applebaum, MD, [name], [facility] 09/09/2020  8:50 AM

## 2020-09-13 LAB — CULTURE, BETA STREP (GROUP B ONLY): Strep Gp B Culture: NEGATIVE

## 2020-09-15 ENCOUNTER — Encounter: Payer: Medicaid Other | Admitting: Physical Therapy

## 2020-09-16 ENCOUNTER — Other Ambulatory Visit: Payer: Self-pay

## 2020-09-16 ENCOUNTER — Encounter: Payer: Self-pay | Admitting: Obstetrics and Gynecology

## 2020-09-16 ENCOUNTER — Observation Stay: Payer: Medicaid Other

## 2020-09-16 ENCOUNTER — Ambulatory Visit (INDEPENDENT_AMBULATORY_CARE_PROVIDER_SITE_OTHER): Payer: Medicaid Other | Admitting: Obstetrics and Gynecology

## 2020-09-16 ENCOUNTER — Observation Stay
Admission: EM | Admit: 2020-09-16 | Discharge: 2020-09-16 | Disposition: A | Discharge status: Home / Self Care | Payer: Medicaid Other | Attending: Obstetrics | Admitting: Obstetrics

## 2020-09-16 ENCOUNTER — Ambulatory Visit (INDEPENDENT_AMBULATORY_CARE_PROVIDER_SITE_OTHER): Payer: Medicaid Other

## 2020-09-16 VITALS — BP 130/72 | Ht 65.0 in | Wt 286.4 lb

## 2020-09-16 DIAGNOSIS — O0993 Supervision of high risk pregnancy, unspecified, third trimester: Secondary | ICD-10-CM | POA: Diagnosis not present

## 2020-09-16 DIAGNOSIS — O368330 Maternal care for abnormalities of the fetal heart rate or rhythm, third trimester, not applicable or unspecified: Secondary | ICD-10-CM | POA: Diagnosis present

## 2020-09-16 DIAGNOSIS — O4103X0 Oligohydramnios, third trimester, not applicable or unspecified: Secondary | ICD-10-CM | POA: Diagnosis not present

## 2020-09-16 DIAGNOSIS — O0990 Supervision of high risk pregnancy, unspecified, unspecified trimester: Secondary | ICD-10-CM

## 2020-09-16 DIAGNOSIS — O99213 Obesity complicating pregnancy, third trimester: Secondary | ICD-10-CM

## 2020-09-16 DIAGNOSIS — Z3A37 37 weeks gestation of pregnancy: Secondary | ICD-10-CM

## 2020-09-16 DIAGNOSIS — Z6841 Body Mass Index (BMI) 40.0 and over, adult: Secondary | ICD-10-CM

## 2020-09-16 NOTE — Final Progress Note (Signed)
Final Progress Note  Patient ID: Adrienne Clarke MRN: [id number] DOB/AGE: December 03, 1986 34 y.o.  Admit date: 09/16/2020 Admitting provider: [name], CNM Discharge date: 09/16/2020   Admission Diagnoses: [redacted] weeks gestation of pregnancy  Discharge Diagnoses:  Active Problems:   [redacted] weeks gestation of pregnancy  Reactive NST Normal AFI  History of Present Illness: The patient is a 34 y.o. female [phone] at [redacted]w[redacted]d who presents for is sent from the [facility] OB office secondary to a growth scan today that indicated low AFI. A repeat ultrasound is deisred to evaluate her amniotic fluid level and check for growth. Babette reports good fetal movement. 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  Past Medical History:  Diagnosis Date  . Cervical dysplasia   . Obesity   . Vaginal Pap smear, abnormal     Past Surgical History:  Procedure Laterality Date  . 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 COLPOSCOPY      No current facility-administered medications on file prior to encounter.   Current Outpatient Medications on File Prior to Encounter  Medication Sig Dispense Refill  . folic acid (FOLVITE) 1 MG tablet Take 1 tablet (1 mg total) by mouth daily. 30 tablet 10  . Multiple Vitamins-Minerals (MULTIVITAMIN WOMEN) TABS Take 1 tablet by mouth 1 day or 1 dose.      No Known Allergies  Social History   Socioeconomic History  . Marital status: Significant Other    Spouse name: Kenyatta  . Number of children: 3  . Years of education: 12+  . Highest education level: Not on file  Occupational History  . Not on file  Tobacco Use  . Smoking status: Never Smoker  . Smokeless tobacco: Never Used  Vaping Use  . Vaping Use: Never used  Substance and Sexual Activity  . Alcohol use: No  . Drug use: No  .  Sexual activity: Yes    Partners: Male    Birth control/protection: None  Other Topics Concern  . Not on file  Social History Narrative  . Not on file   Social Determinants of Health   Financial Resource Strain:   . Difficulty of Paying Living Expenses: Not on file  Food Insecurity:   . Worried About Charity fundraiser in the Last Year: Not on file  . Ran Out of Food in the Last Year: Not on file  Transportation Needs:   . Lack of Transportation (Medical): Not on file  . Lack of Transportation (Non-Medical): Not on file  Physical Activity:   . Days of Exercise per Week: Not on file  . Minutes of Exercise per Session: Not on file  Stress:   . Feeling of Stress : Not on file  Social Connections:   . Frequency of Communication with Friends and Family: Not on file  . Frequency of Social Gatherings with Friends and Family: Not on file  . Attends Religious Services: Not on file  . Active Member of Clubs or Organizations: Not on file  . Attends Archivist Meetings: Not on file  . Marital Status: Not on file  Intimate Partner Violence:   . Fear of Current or Ex-Partner: Not on file  . Emotionally Abused: Not on file  . Physically Abused: Not on file  . Sexually Abused: Not  on file    Family History  Problem Relation Age of Onset  . AAA (abdominal aortic aneurysm) Maternal Grandfather      Review of Systems  Constitutional: Negative.   HENT: Negative.   Eyes: Negative.   Respiratory: Negative.   Cardiovascular: Negative.   Gastrointestinal: Negative.   Genitourinary: Negative.   Musculoskeletal: Negative.   Skin: Negative.   Neurological: Negative.   Endo/Heme/Allergies: Negative.   Psychiatric/Behavioral: Negative.      Physical Exam: BP 130/77   Pulse 91   Temp 98.6 F (37 C) (Oral)   LMP 12/28/2019 (Exact Date) Comment: abnormal  Physical Exam Constitutional:      Appearance: Normal appearance. She is obese.  HENT:     Head: Normocephalic.   Cardiovascular:     Rate and Rhythm: Normal rate and regular rhythm.     Pulses: Normal pulses.     Heart sounds: Normal heart sounds.  Pulmonary:     Effort: Pulmonary effort is normal.     Breath sounds: Normal breath sounds.  Abdominal:     Palpations: Abdomen is soft.     Comments: Gravid, obese  Musculoskeletal:        General: Normal range of motion.  Neurological:     General: No focal deficit present.     Mental Status: She is alert and oriented to person, place, and time.  Skin:    General: Skin is warm and dry.  Psychiatric:        Mood and Affect: Mood normal.        Behavior: Behavior normal.     Consults: None  Significant Findings/ Diagnostic Studies: radiology: Ultrasound: shows growth at the 76%. the AFI today is 10.0 with a vertical pocket of 2.8 cms  Procedures: OB ultrasound NST Baseline FHR: 140 beats/min Variability: moderate Accelerations: present Decelerations: absent Tocometry: rare Braxton Hicks contractions  Interpretation:  INDICATIONS: rule out  oligohydramnios RESULTS:  A NST procedure was performed with FHR monitoring and a normal baseline established, appropriate time of 20-40 minutes of evaluation, and accels >2 seen w 15x15 characteristics.  Results show a REACTIVE NST.    Hospital Course: The patient was admitted to Labor and Delivery Triage for observation. She was monitored for an NST, with Category 1 FHTS and had a OB ultrasound. The fluid is adequate, and the growth at the 76th percentile  Discharge Condition: good  Disposition: Discharge disposition: 01-Home or Self Care       Diet: Regular diet  Discharge Activity: Activity as tolerated. RTC in one week for ROB and repeat ultrasound  Discharge Instructions    Fetal Kick Count:  Lie on our left side for one hour after a meal, and count the number of times your baby kicks.  If it is less than 5 times, get up, move around and drink some juice.  Repeat the test 30 minutes  later.  If it is still less than 5 kicks in an hour, notify your doctor.   Complete by: As directed    LABOR:  When conractions begin, you should start to time them from the beginning of one contraction to the beginning  of the next.  When contractions are 5 - 10 minutes apart or less and have been regular for at least an hour, you should call your health care provider.   Complete by: As directed    Notify physician for bleeding from the vagina   Complete by: As directed    Notify physician for  blurring of vision or spots before the eyes   Complete by: As directed    Notify physician for chills or fever   Complete by: As directed    Notify physician for fainting spells, "black outs" or loss of consciousness   Complete by: As directed    Notify physician for increase in vaginal discharge   Complete by: As directed    Notify physician for leaking of fluid   Complete by: As directed    Notify physician for pain or burning when urinating   Complete by: As directed    Notify physician for pelvic pressure (sudden increase)   Complete by: As directed    Notify physician for severe or continued nausea or vomiting   Complete by: As directed    Notify physician for sudden gushing of fluid from the vagina (with or without continued leaking)   Complete by: As directed    Notify physician for sudden, constant, or occasional abdominal pain   Complete by: As directed    Notify physician if baby moving less than usual   Complete by: As directed      Allergies as of 09/16/2020   No Known Allergies     Medication List    TAKE these medications   folic acid 1 MG tablet Commonly known as: FOLVITE Take 1 tablet (1 mg total) by mouth daily.   Multivitamin Women Tabs Take 1 tablet by mouth 1 day or 1 dose.        Total time spent taking care of this patient: 30 minutes  Signed: [name], CNM  09/16/2020, 2:20 PM

## 2020-09-16 NOTE — Patient Instructions (Signed)
Third Trimester of Pregnancy The third trimester is from week 28 through week 40 (months 7 through 9). The third trimester is a time when the unborn baby (fetus) is growing rapidly. At the end of the ninth month, the fetus is about 20 inches in length and weighs 6-10 pounds. Body changes during your third trimester Your body will continue to go through many changes during pregnancy. The changes vary from woman to woman. During the third trimester:  Your weight will continue to increase. You can expect to gain 25-35 pounds (11-16 kg) by the end of the pregnancy.  You may begin to get stretch marks on your hips, abdomen, and breasts.  You may urinate more often because the fetus is moving lower into your pelvis and pressing on your bladder.  You may develop or continue to have heartburn. This is caused by increased hormones that slow down muscles in the digestive tract.  You may develop or continue to have constipation because increased hormones slow digestion and cause the muscles that push waste through your intestines to relax.  You may develop hemorrhoids. These are swollen veins (varicose veins) in the rectum that can itch or be painful.  You may develop swollen, bulging veins (varicose veins) in your legs.  You may have increased body aches in the pelvis, back, or thighs. This is due to weight gain and increased hormones that are relaxing your joints.  You may have changes in your hair. These can include thickening of your hair, rapid growth, and changes in texture. Some women also have hair loss during or after pregnancy, or hair that feels dry or thin. Your hair will most likely return to normal after your baby is born.  Your breasts will continue to grow and they will continue to become tender. A yellow fluid (colostrum) may leak from your breasts. This is the first milk you are producing for your baby.  Your belly button may stick out.  You may notice more swelling in your hands,  face, or ankles.  You may have increased tingling or numbness in your hands, arms, and legs. The skin on your belly may also feel numb.  You may feel short of breath because of your expanding uterus.  You may have more problems sleeping. This can be caused by the size of your belly, increased need to urinate, and an increase in your body's metabolism.  You may notice the fetus "dropping," or moving lower in your abdomen (lightening).  You may have increased vaginal discharge.  You may notice your joints feel loose and you may have pain around your pelvic bone. What to expect at prenatal visits You will have prenatal exams every 2 weeks until week 36. Then you will have weekly prenatal exams. During a routine prenatal visit:  You will be weighed to make sure you and the baby are growing normally.  Your blood pressure will be taken.  Your abdomen will be measured to track your baby's growth.  The fetal heartbeat will be listened to.  Any test results from the previous visit will be discussed.  You may have a cervical check near your due date to see if your cervix has softened or thinned (effaced).  You will be tested for Group B streptococcus. This happens between 35 and 37 weeks. Your health care provider may ask you:  What your birth plan is.  How you are feeling.  If you are feeling the baby move.  If you have had any abnormal   symptoms, such as leaking fluid, bleeding, severe headaches, or abdominal cramping.  If you are using any tobacco products, including cigarettes, chewing tobacco, and electronic cigarettes.  If you have any questions. Other tests or screenings that may be performed during your third trimester include:  Blood tests that check for low iron levels (anemia).  Fetal testing to check the health, activity level, and growth of the fetus. Testing is done if you have certain medical conditions or if there are problems during the pregnancy.  Nonstress test  (NST). This test checks the health of your baby to make sure there are no signs of problems, such as the baby not getting enough oxygen. During this test, a belt is placed around your belly. The baby is made to move, and its heart rate is monitored during movement. What is false labor? False labor is a condition in which you feel small, irregular tightenings of the muscles in the womb (contractions) that usually go away with rest, changing position, or drinking water. These are called Braxton Hicks contractions. Contractions may last for hours, days, or even weeks before true labor sets in. If contractions come at regular intervals, become more frequent, increase in intensity, or become painful, you should see your health care provider. What are the signs of labor?  Abdominal cramps.  Regular contractions that start at 10 minutes apart and become stronger and more frequent with time.  Contractions that start on the top of the uterus and spread down to the lower abdomen and back.  Increased pelvic pressure and dull back pain.  A watery or bloody mucus discharge that comes from the vagina.  Leaking of amniotic fluid. This is also known as your "water breaking." It could be a slow trickle or a gush. Let your health care provider know if it has a color or strange odor. If you have any of these signs, call your health care provider right away, even if it is before your due date. Follow these instructions at home: Medicines  Follow your health care provider's instructions regarding medicine use. Specific medicines may be either safe or unsafe to take during pregnancy.  Take a prenatal vitamin that contains at least 600 micrograms (mcg) of folic acid.  If you develop constipation, try taking a stool softener if your health care provider approves. Eating and drinking   Eat a balanced diet that includes fresh fruits and vegetables, whole grains, good sources of protein such as meat, eggs, or tofu,  and low-fat dairy. Your health care provider will help you determine the amount of weight gain that is right for you.  Avoid raw meat and uncooked cheese. These carry germs that can cause birth defects in the baby.  If you have low calcium intake from food, talk to your health care provider about whether you should take a daily calcium supplement.  Eat four or five small meals rather than three large meals a day.  Limit foods that are high in fat and processed sugars, such as fried and sweet foods.  To prevent constipation: ? Drink enough fluid to keep your urine clear or pale yellow. ? Eat foods that are high in fiber, such as fresh fruits and vegetables, whole grains, and beans. Activity  Exercise only as directed by your health care provider. Most women can continue their usual exercise routine during pregnancy. Try to exercise for 30 minutes at least 5 days a week. Stop exercising if you experience uterine contractions.  Avoid heavy lifting.  Do   not exercise in extreme heat or humidity, or at high altitudes.  Wear low-heel, comfortable shoes.  Practice good posture.  You may continue to have sex unless your health care provider tells you otherwise. Relieving pain and discomfort  Take frequent breaks and rest with your legs elevated if you have leg cramps or low back pain.  Take warm sitz baths to soothe any pain or discomfort caused by hemorrhoids. Use hemorrhoid cream if your health care provider approves.  Wear a good support bra to prevent discomfort from breast tenderness.  If you develop varicose veins: ? Wear support pantyhose or compression stockings as told by your healthcare provider. ? Elevate your feet for 15 minutes, 3-4 times a day. Prenatal care  Write down your questions. Take them to your prenatal visits.  Keep all your prenatal visits as told by your health care provider. This is important. Safety  Wear your seat belt at all times when driving.  Make  a list of emergency phone numbers, including numbers for family, friends, the hospital, and police and fire departments. General instructions  Avoid cat litter boxes and soil used by cats. These carry germs that can cause birth defects in the baby. If you have a cat, ask someone to clean the litter box for you.  Do not travel far distances unless it is absolutely necessary and only with the approval of your health care provider.  Do not use hot tubs, steam rooms, or saunas.  Do not drink alcohol.  Do not use any products that contain nicotine or tobacco, such as cigarettes and e-cigarettes. If you need help quitting, ask your health care provider.  Do not use any medicinal herbs or unprescribed drugs. These chemicals affect the formation and growth of the baby.  Do not douche or use tampons or scented sanitary pads.  Do not cross your legs for long periods of time.  To prepare for the arrival of your baby: ? Take prenatal classes to understand, practice, and ask questions about labor and delivery. ? Make a trial run to the hospital. ? Visit the hospital and tour the maternity area. ? Arrange for maternity or paternity leave through employers. ? Arrange for family and friends to take care of pets while you are in the hospital. ? Purchase a rear-facing car seat and make sure you know how to install it in your car. ? Pack your hospital bag. ? Prepare the baby's nursery. Make sure to remove all pillows and stuffed animals from the baby's crib to prevent suffocation.  Visit your dentist if you have not gone during your pregnancy. Use a soft toothbrush to brush your teeth and be gentle when you floss. Contact a health care provider if:  You are unsure if you are in labor or if your water has broken.  You become dizzy.  You have mild pelvic cramps, pelvic pressure, or nagging pain in your abdominal area.  You have lower back pain.  You have persistent nausea, vomiting, or  diarrhea.  You have an unusual or bad smelling vaginal discharge.  You have pain when you urinate. Get help right away if:  Your water breaks before 37 weeks.  You have regular contractions less than 5 minutes apart before 37 weeks.  You have a fever.  You are leaking fluid from your vagina.  You have spotting or bleeding from your vagina.  You have severe abdominal pain or cramping.  You have rapid weight loss or weight gain.  You have   shortness of breath with chest pain.  You notice sudden or extreme swelling of your face, hands, ankles, feet, or legs.  Your baby makes fewer than 10 movements in 2 hours.  You have severe headaches that do not go away when you take medicine.  You have vision changes. Summary  The third trimester is from week 28 through week 40, months 7 through 9. The third trimester is a time when the unborn baby (fetus) is growing rapidly.  During the third trimester, your discomfort may increase as you and your baby continue to gain weight. You may have abdominal, leg, and back pain, sleeping problems, and an increased need to urinate.  During the third trimester your breasts will keep growing and they will continue to become tender. A yellow fluid (colostrum) may leak from your breasts. This is the first milk you are producing for your baby.  False labor is a condition in which you feel small, irregular tightenings of the muscles in the womb (contractions) that eventually go away. These are called Braxton Hicks contractions. Contractions may last for hours, days, or even weeks before true labor sets in.  Signs of labor can include: abdominal cramps; regular contractions that start at 10 minutes apart and become stronger and more frequent with time; watery or bloody mucus discharge that comes from the vagina; increased pelvic pressure and dull back pain; and leaking of amniotic fluid. This information is not intended to replace advice given to you by your  health care provider. Make sure you discuss any questions you have with your health care provider. Document Revised: 03/26/2019 Document Reviewed: 01/08/2017 Elsevier Patient Education  2020 [employer]  

## 2020-09-16 NOTE — Progress Notes (Signed)
Routine Prenatal Care Visit  Subjective  Adrienne Clarke is a 34 y.o. G4P3003 at [redacted]w[redacted]d being seen today for ongoing prenatal care.  She is currently monitored for the following issues for this high-risk pregnancy and has Morbid obesity with BMI of 45.0-49.9, adult ([place]); History of LEEP (loop electrosurgical excision procedure) of cervix complicating pregnancy; Supervision of high risk pregnancy, antepartum; History of cervical dysplasia; Obesity affecting pregnancy in third trimester; and Abdominal pain during pregnancy in second trimester on their problem list.  ----------------------------------------------------------------------------------- Patient reports no complaints.   Contractions: Not present. Vag. Bleeding: None.  Movement: Present. Denies leaking of fluid.  ----------------------------------------------------------------------------------- The following portions of the patient's history were reviewed and updated as appropriate: allergies, current medications, past family history, past medical history, past social history, past surgical history and problem list. Problem list updated.   Objective  Blood pressure 130/72, height 5\' 5"  (1.651 m), weight 286 lb 6.4 oz (129.9 kg), last menstrual period 12/28/2019. Pregravid weight 275 lb (124.7 kg) Total Weight Gain 11 lb 6.4 oz (5.171 kg) Urinalysis:      Fetal Status: Fetal Heart Rate (bpm): 135   Movement: Present     General:  Alert, oriented and cooperative. Patient is in no acute distress.  Skin: Skin is warm and dry. No rash noted.   Cardiovascular: Normal heart rate noted  Respiratory: Normal respiratory effort, no problems with respiration noted  Abdomen: Soft, gravid, appropriate for gestational age. Pain/Pressure: Absent     Pelvic:  Cervical exam deferred        Extremities: Normal range of motion.     Mental Status: Normal mood and affect. Normal behavior. Normal judgment and thought content.     Assessment    34 y.o. [id number] at [redacted]w[redacted]d by  10/03/2020, by Last Menstrual Period presenting for routine prenatal visit  Plan   FOURTH Problems (from 02/09/20 to present)    Problem Noted Resolved   Obesity affecting pregnancy in third trimester 03/23/2020 by Homero Fellers, MD No   History of LEEP (loop electrosurgical excision procedure) of cervix complicating pregnancy 2/50/0370 by Dalia Heading, CNM No   Overview Signed 02/10/2020  4:37 PM by Dalia Heading, CNM    Cervical length at 16 weeks      Supervision of high risk pregnancy, antepartum 02/10/2020 by Dalia Heading, CNM No   Overview Addendum 09/16/2020  9:44 AM by Homero Fellers, [place] Prenatal Labs  Dating By LMP=8w Blood type: O/Positive/-- (02/23 1233)   Genetic Screen  NIPS: normal xx Antibody:Negative (02/23 1233)  Anatomic [place] complete Rubella: 2.51 (02/23 1233)  Varicella: Immune  GTT Early:  106 Third trimester: 118 RPR: Non Reactive (02/23 1233)   Rhogam  not needed HBsAg: Negative (02/23 1233)   Vaccines TDAP:  07/27/20                     Flu Shot:Declines HIV: Non Reactive (02/23 1233)   Baby Food Breast- breastfed for 8 mos last pregnancy     GBS: negative  Contraception Unsure, Nexplanon Pap: 2021 NIL  CBB  no   CS n/a Patient has received covid vaccination   Support Person     High Risk Pregnancy Diagnoses: Obesity in pregnancy Anemia in pregnancy Oligohydramnios- noted 09/16/2020      Previous Version      Sent to L&D for evaluationand confirmtion of oligohydramnios.  NST: 135 bpm baseline, moderate variability, 15x15 accelerations, no decelerations  Gestational age appropriate obstetric precautions including but not limited to vaginal bleeding, contractions, leaking of fluid and fetal movement were reviewed in detail with the patient.    Return in about 1 week (around 09/23/2020) for ROB with MD/ NST/ growth and AFI  once a week. Homero Fellers MD [facility] 09/16/2020, 9:46 AM

## 2020-09-16 NOTE — Progress Notes (Signed)
Pt here for [place] and NST today.

## 2020-09-16 NOTE — OB Triage Note (Signed)
Patient here for NST and [place] sent over from MD office for questionable low fluid.

## 2020-09-19 NOTE — Discharge Summary (Signed)
Please see Final Progress Note. [name], CNM  09/19/2020 5:13 PM

## 2020-09-23 ENCOUNTER — Other Ambulatory Visit: Payer: Self-pay

## 2020-09-23 ENCOUNTER — Inpatient Hospital Stay (INDEPENDENT_AMBULATORY_CARE_PROVIDER_SITE_OTHER): Payer: Medicaid Other

## 2020-09-23 ENCOUNTER — Ambulatory Visit (INDEPENDENT_AMBULATORY_CARE_PROVIDER_SITE_OTHER): Payer: Medicaid Other | Admitting: Obstetrics

## 2020-09-23 VITALS — BP 130/80 | Wt 283.0 lb

## 2020-09-23 DIAGNOSIS — O0990 Supervision of high risk pregnancy, unspecified, unspecified trimester: Secondary | ICD-10-CM

## 2020-09-23 DIAGNOSIS — Z6841 Body Mass Index (BMI) 40.0 and over, adult: Secondary | ICD-10-CM

## 2020-09-23 DIAGNOSIS — Z3A38 38 weeks gestation of pregnancy: Secondary | ICD-10-CM

## 2020-09-23 LAB — POCT URINALYSIS DIPSTICK OB
Glucose, UA: NEGATIVE
POC,PROTEIN,UA: NEGATIVE

## 2020-09-23 NOTE — Progress Notes (Signed)
Routine Prenatal Care Visit  Subjective  Adrienne Clarke is a 34 y.o. G4P3003 at [redacted]w[redacted]d being seen today for ongoing prenatal care.  She is currently monitored for the following issues for this high-risk pregnancy and has Morbid obesity with BMI of 45.0-49.9, adult (Adrienne Clarke); History of LEEP (loop electrosurgical excision procedure) of cervix complicating pregnancy; Supervision of high risk pregnancy, antepartum; History of cervical dysplasia; Obesity affecting pregnancy in third trimester; Abdominal pain during pregnancy in second trimester; Antepartum oligohydramnios in third trimester, delivered, single or unspecified fetus; and [redacted] weeks gestation of pregnancy on their problem list.  ----------------------------------------------------------------------------------- Patient reports no complaints.    .  .   Adrienne Clarke Fluid denies.  ----------------------------------------------------------------------------------- The following portions of the patient's history were reviewed and updated as appropriate: allergies, current medications, past family history, past medical history, past social history, past surgical history and problem list. Problem list updated.  Objective  Blood pressure 130/80, weight 283 lb (128.4 kg), last menstrual period 12/28/2019. Pregravid weight 275 lb (124.7 kg) Total Weight Gain 8 lb (3.629 kg) Urinalysis: Urine Protein Negative  Urine Glucose Negative  Fetal Status:           General:  Alert, oriented and cooperative. Patient is in no acute distress.  Skin: Skin is warm and dry. No rash noted.   Cardiovascular: Normal heart rate noted  Respiratory: Normal respiratory effort, no problems with respiration noted  Abdomen: Soft, gravid, appropriate for gestational age.       Pelvic:  Cervical exam performed      closed/30%/-3 cervix is firm  Extremities: Normal range of motion.     Mental Status: Normal mood and affect. Normal behavior. Normal judgment and thought  content.   Assessment   34 y.o. [id number] at [redacted]w[redacted]d by  10/03/2020, by Last Menstrual Period presenting for routine prenatal visit High BMI. Lower AF, for biweekly NSTs. AFI today is 7.5 cms.   Plan   FOURTH Problems (from 02/09/20 to present)    Problem Noted Resolved   Antepartum oligohydramnios in third trimester, delivered, single or unspecified fetus 09/16/2020 by Adrienne Fellers, MD No   Obesity affecting pregnancy in third trimester 03/23/2020 by Adrienne Fellers, MD No   History of LEEP (loop electrosurgical excision procedure) of cervix complicating pregnancy 3/53/6144 by Adrienne Clarke, CNM No   Overview Signed 02/10/2020  4:37 PM by Adrienne Clarke, CNM    Cervical length at 16 weeks      Supervision of high risk pregnancy, antepartum 02/10/2020 by Adrienne Clarke, CNM No   Overview Addendum 09/16/2020  9:44 AM by Adrienne Clarke, [place] Prenatal Labs  Dating By LMP=8w Blood type: O/Positive/-- (02/23 1233)   Genetic Screen  NIPS: normal xx Antibody:Negative (02/23 1233)  Anatomic [place] complete Rubella: 2.51 (02/23 1233)  Varicella: Immune  GTT Early:  106 Third trimester: 118 RPR: Non Reactive (02/23 1233)   Rhogam  not needed HBsAg: Negative (02/23 1233)   Vaccines TDAP:  07/27/20                     Flu Shot:Declines HIV: Non Reactive (02/23 1233)   Baby Food Breast- breastfed for 8 mos last pregnancy     GBS: negative  Contraception Unsure, Adrienne Clarke Pap: 2021 NIL  CBB  no   CS n/a Patient has received covid vaccination   Support Person     High Risk Pregnancy Diagnoses: Obesity in pregnancy Anemia in pregnancy Oligohydramnios- noted  09/16/2020      Previous Version       Term labor symptoms and general obstetric precautions including but not limited to vaginal bleeding, contractions, leaking of fluid and fetal movement were reviewed in detail with the patient. Please refer to After Visit Summary for other counseling  recommendations.  Discussed an Adrienne Clarke once 39 weeks - she would like Adrienne Clarke. Will arrange and discuss at her next appt in three or 4 days.  Return in about 1 week (around 09/30/2020) for return OB early next week for NST.  Adrienne Clarke, CNM  09/23/2020 11:13 AM

## 2020-09-23 NOTE — Progress Notes (Signed)
C/o no concerns.rj

## 2020-09-26 ENCOUNTER — Ambulatory Visit (INDEPENDENT_AMBULATORY_CARE_PROVIDER_SITE_OTHER): Payer: Medicaid Other | Admitting: Obstetrics and Gynecology

## 2020-09-26 ENCOUNTER — Encounter: Payer: Self-pay | Admitting: Obstetrics and Gynecology

## 2020-09-26 ENCOUNTER — Other Ambulatory Visit: Payer: Self-pay

## 2020-09-26 VITALS — BP 118/70 | Ht 65.0 in | Wt 284.2 lb

## 2020-09-26 DIAGNOSIS — O99213 Obesity complicating pregnancy, third trimester: Secondary | ICD-10-CM | POA: Diagnosis not present

## 2020-09-26 DIAGNOSIS — O0990 Supervision of high risk pregnancy, unspecified, unspecified trimester: Secondary | ICD-10-CM

## 2020-09-26 LAB — POCT URINALYSIS DIPSTICK OB
Glucose, UA: NEGATIVE
POC,PROTEIN,UA: NEGATIVE

## 2020-09-26 LAB — FETAL NONSTRESS TEST

## 2020-09-26 NOTE — Progress Notes (Signed)
Routine Prenatal Care Visit  Subjective  Adrienne Clarke is a 34 y.o. G4P3003 at [redacted]w[redacted]d being seen today for ongoing prenatal care.  She is currently monitored for the following issues for this high-risk pregnancy and has Morbid obesity with BMI of 45.0-49.9, adult ([place]); History of LEEP (loop electrosurgical excision procedure) of cervix complicating pregnancy; Supervision of high risk pregnancy, antepartum; History of cervical dysplasia; Obesity affecting pregnancy in third trimester; Abdominal pain during pregnancy in second trimester; Antepartum oligohydramnios in third trimester, delivered, single or unspecified fetus; and [redacted] weeks gestation of pregnancy on their problem list.  ----------------------------------------------------------------------------------- Patient reports no complaints.   Contractions: Irritability. Vag. Bleeding: None.  Movement: Present. Denies leaking of fluid.  ----------------------------------------------------------------------------------- The following portions of the patient's history were reviewed and updated as appropriate: allergies, current medications, past family history, past medical history, past social history, past surgical history and problem list. Problem list updated.   Objective  Blood pressure 118/70, height 5\' 5"  (1.651 m), weight 284 lb 3.2 oz (128.9 kg), last menstrual period 12/28/2019. Pregravid weight 275 lb (124.7 kg) Total Weight Gain 9 lb 3.2 oz (4.173 kg) Urinalysis:      Fetal Status: Fetal Heart Rate (bpm): 140   Movement: Present     General:  Alert, oriented and cooperative. Patient is in no acute distress.  Skin: Skin is warm and dry. No rash noted.   Cardiovascular: Normal heart rate noted  Respiratory: Normal respiratory effort, no problems with respiration noted  Abdomen: Soft, gravid, appropriate for gestational age. Pain/Pressure: Absent     Pelvic:  Cervical exam performed        Extremities: Normal range of motion.      Mental Status: Normal mood and affect. Normal behavior. Normal judgment and thought content.     Assessment   34 y.o. [id number] at [redacted]w[redacted]d by  10/03/2020, by Last Menstrual Period presenting for routine prenatal visit  Plan   FOURTH Problems (from 02/09/20 to present)    Problem Noted Resolved   Antepartum oligohydramnios in third trimester, delivered, single or unspecified fetus 09/16/2020 by Homero Fellers, MD No   Obesity affecting pregnancy in third trimester 03/23/2020 by Homero Fellers, MD No   History of LEEP (loop electrosurgical excision procedure) of cervix complicating pregnancy 0/17/5102 by Dalia Heading, CNM No   Overview Signed 02/10/2020  4:37 PM by Dalia Heading, CNM    Cervical length at 16 weeks      Supervision of high risk pregnancy, antepartum 02/10/2020 by Dalia Heading, CNM No   Overview Addendum 09/23/2020  1:55 PM by [name], [place] Prenatal Labs  Dating By LMP=8w Blood type: O/Positive/-- (02/23 1233)   Genetic Screen  NIPS: normal xx Antibody:Negative (02/23 1233)  Anatomic [place] complete Rubella: 2.51 (02/23 1233)  Varicella: Immune  GTT Early:  106 Third trimester: 118 RPR: Non Reactive (02/23 1233)   Rhogam  not needed HBsAg: Negative (02/23 1233)   Vaccines TDAP:  07/27/20                     Flu Shot:Declines HIV: Non Reactive (02/23 1233)   Baby Food Breast- breastfed for 8 mos last pregnancy     GBS: negative  Contraception Unsure, Nexplanon Pap: 2021 NIL  CBB  no   CS n/a Patient has received covid vaccination   Support Person     High Risk Pregnancy Diagnoses: Obesity in pregnancy Anemia in pregnancy Oligohydramnios- noted  09/16/2020, normal 09/26/2020 Scheduled  For IOL on Thursday, October 14th at 0500.      Previous Version      NST: 140 bpm baseline, moderate variability, 15x15 accelerations, no decelerations.   Gestational age appropriate obstetric precautions including but not  limited to vaginal bleeding, contractions, leaking of fluid and fetal movement were reviewed in detail with the patient.    No follow-ups on file.  Homero Fellers MD [facility], [facility] 09/26/2020, 2:40 PM

## 2020-09-26 NOTE — Patient Instructions (Signed)
Pain Relief During Labor and Delivery Many things can cause pain during labor and delivery, including:  Pressure on bones and ligaments due to the baby moving through the pelvis.  Stretching of tissues due to the baby moving through the birth canal.  Muscle tension due to anxiety or nervousness.  The uterus tightening (contracting) and relaxing to help move the baby. There are many ways to deal with the pain of labor and delivery. They include:  Taking prenatal classes. Taking these classes helps you know what to expect during your baby's birth. What you learn will increase your confidence and decrease your anxiety.  Practicing relaxation techniques or doing relaxing activities, such as: ? Focused breathing. ? Meditation. ? Visualization. ? Aroma therapy. ? Listening to your favorite music. ? Hypnosis.  Taking a warm shower or bath (hydrotherapy). This may: ? Provide comfort and relaxation. ? Lessen your perception of pain. ? Decrease the amount of pain medicine needed. ? Decrease the length of labor.  Getting a massage or counterpressure on your back.  Applying warm packs or ice packs.  Changing positions often, moving around, or using a birthing ball.  Getting: ? Pain medicine through an IV or injection into a muscle. ? Pain medicine inserted into your spinal column. ? Injections of sterile water just under the skin on your lower back (intradermal injections). ? Laughing gas (nitrous oxide). Discuss your pain control options with your health care provider during your prenatal visits. Explore the options offered by your hospital or birth center. What kinds of medicine are available? There are two kinds of medicines that can be used to relieve pain during labor and delivery:  Analgesics. These medicines decrease pain without causing you to lose feeling or the ability to move your muscles.  Anesthetics. These medicines block feeling in the body and can decrease your  ability to move freely. Both of these kinds of medicine can cause minor side effects, such as nausea, trouble concentrating, and sleepiness. They can also decrease the baby's heart rate before birth and affect the baby's breathing rate after birth. For this reason, health care providers are careful about when and how much medicine is given. What are specific medicines and procedures that provide pain relief? Local Anesthetics Local anesthetics are used to numb a small area of the body. They may be used along with another kind of anesthetic or used to numb the nerves of the vagina, cervix, and perineum during the second stage of labor. General Anesthetics General anesthetics cause you to lose consciousness so you do not feel pain. They are usually only used for an emergency cesarean delivery. General anesthetics are given through an IV tube and a mask. Pudendal Block A pudendal block is a form of local anesthetic. It may be used to relieve the pain associated with pushing or stretching of the perineum at the time of delivery or to further numb the perineum. A pudendal block is done by injecting numbing medicine through the vaginal wall into a nerve in the pelvis. Epidural Analgesia Epidural analgesia is given through a flexible IV catheter that is inserted into the lower back. Numbing medicine is delivered continuously to the area near your spinal column nerves (epidural space). After having this type of analgesia, you may be able to move your legs but you most likely will not be able to walk. Depending on the amount of medicine given, you may lose all feeling in the lower half of your body, or you may retain some level  of sensation, including the urge to push. Epidural analgesia can be used to provide pain relief for a vaginal birth. Spinal Block A spinal block is similar to epidural analgesia, but the medicine is injected into the spinal fluid instead of the epidural space. A spinal block is only given  once. It starts to relieve pain quickly, but the pain relief lasts only 1-6 hours. Spinal blocks can be used for cesarean deliveries. Combined Spinal-Epidural (CSE) Block A CSE block combines the effects of a spinal block and epidural analgesia. The spinal block works quickly to block all pain. The epidural analgesia provides continuous pain relief, even after the effects of the spinal block have worn off. This information is not intended to replace advice given to you by your health care provider. Make sure you discuss any questions you have with your health care provider. Document Revised: 11/15/2017 Document Reviewed: 04/25/2016 Elsevier Patient Education  2020 [place] induction is when steps are taken to cause a pregnant woman to begin the labor process. Most women go into labor on their own between 37 weeks and 42 weeks of pregnancy. When this does not happen or when there is a medical need for labor to begin, steps may be taken to induce labor. Labor induction causes a pregnant woman's uterus to contract. It also causes the cervix to soften (ripen), open (dilate), and thin out (efface). Usually, labor is not induced before 39 weeks of pregnancy unless there is a medical reason to do so. Your health care provider will determine if labor induction is needed. Before inducing labor, your health care provider will consider a number of factors, including:  Your medical condition and your baby's.  How many weeks along you are in your pregnancy.  How mature your baby's lungs are.  The condition of your cervix.  The position of your baby.  The size of your birth canal. What are some reasons for labor induction? Labor may be induced if:  Your health or your baby's health is at risk.  Your pregnancy is overdue by 1 week or more.  Your water breaks but labor does not start on its own.  There is a low amount of amniotic fluid around your baby. You may also  choose (elect) to have labor induced at a certain time. Generally, elective labor induction is done no earlier than 39 weeks of pregnancy. What methods are used for labor induction? Methods used for labor induction include:  Prostaglandin medicine. This medicine starts contractions and causes the cervix to dilate and ripen. It can be taken by mouth (orally) or by being inserted into the vagina (suppository).  Inserting a small, thin tube (catheter) with a balloon into the vagina and then expanding the balloon with water to dilate the cervix.  Stripping the membranes. In this method, your health care provider gently separates amniotic sac tissue from the cervix. This causes the cervix to stretch, which in turn causes the release of a hormone called progesterone. The hormone causes the uterus to contract. This procedure is often done during an office visit, after which you will be sent home to wait for contractions to begin.  Breaking the water. In this method, your health care provider uses a small instrument to make a small hole in the amniotic sac. This eventually causes the amniotic sac to break. Contractions should begin after a few hours.  Medicine to trigger or strengthen contractions. This medicine is given through an IV that  is inserted into a vein in your arm. Except for membrane stripping, which can be done in a clinic, labor induction is done in the hospital so that you and your baby can be carefully monitored. How long does it take for labor to be induced? The length of time it takes to induce labor depends on how ready your body is for labor. Some inductions can take up to 2-3 days, while others may take less than a day. Induction may take longer if:  You are induced early in your pregnancy.  It is your first pregnancy.  Your cervix is not ready. What are some risks associated with labor induction? Some risks associated with labor induction include:  Changes in fetal heart rate,  such as being too high, too low, or irregular (erratic).  Failed induction.  Infection in the mother or the baby.  Increased risk of having a cesarean delivery.  Fetal death.  Breaking off (abruption) of the placenta from the uterus (rare).  Rupture of the uterus (very rare). When induction is needed for medical reasons, the benefits of induction generally outweigh the risks. What are some reasons for not inducing labor? Labor induction should not be done if:  Your baby does not tolerate contractions.  You have had previous surgeries on your uterus, such as a myomectomy, removal of fibroids, or a vertical scar from a previous cesarean delivery.  Your placenta lies very low in your uterus and blocks the opening of the cervix (placenta previa).  Your baby is not in a head-down position.  The umbilical cord drops down into the birth canal in front of the baby.  There are unusual circumstances, such as the baby being very early (premature).  You have had more than 2 previous cesarean deliveries. Summary  Labor induction is when steps are taken to cause a pregnant woman to begin the labor process.  Labor induction causes a pregnant woman's uterus to contract. It also causes the cervix to ripen, dilate, and efface.  Labor is not induced before 39 weeks of pregnancy unless there is a medical reason to do so.  When induction is needed for medical reasons, the benefits of induction generally outweigh the risks. This information is not intended to replace advice given to you by your health care provider. Make sure you discuss any questions you have with your health care provider. Document Revised: 12/06/2017 Document Reviewed: 01/16/2017 Elsevier Patient Education  2020 [place].   Vaginal Delivery  Vaginal delivery means that you give birth by pushing your baby out of your birth canal (vagina). A team of health care providers will help you before, during, and after vaginal  delivery. Birth experiences are unique for every woman and every pregnancy, and birth experiences vary depending on where you choose to give birth. What happens when I arrive at the birth center or hospital? Once you are in labor and have been admitted into the hospital or birth center, your health care provider may:  Review your pregnancy history and any concerns that you have.  Insert an IV into one of your veins. This may be used to give you fluids and medicines.  Check your blood pressure, pulse, temperature, and heart rate (vital signs).  Check whether your bag of water (amniotic sac) has broken (ruptured).  Talk with you about your birth plan and discuss pain control options. Monitoring Your health care provider may monitor your contractions (uterine monitoring) and your baby's heart rate (fetal monitoring). You may need to  be monitored:  Often, but not continuously (intermittently).  All the time or for long periods at a time (continuously). Continuous monitoring may be needed if: ? You are taking certain medicines, such as medicine to relieve pain or make your contractions stronger. ? You have pregnancy or labor complications. Monitoring may be done by:  Placing a special stethoscope or a handheld monitoring device on your abdomen to check your baby's heartbeat and to check for contractions.  Placing monitors on your abdomen (external monitors) to record your baby's heartbeat and the frequency and length of contractions.  Placing monitors inside your uterus through your vagina (internal monitors) to record your baby's heartbeat and the frequency, length, and strength of your contractions. Depending on the type of monitor, it may remain in your uterus or on your baby's head until birth.  Telemetry. This is a type of continuous monitoring that can be done with external or internal monitors. Instead of having to stay in bed, you are able to move around during telemetry. Physical  exam Your health care provider may perform frequent physical exams. This may include:  Checking how and where your baby is positioned in your uterus.  Checking your cervix to determine: ? Whether it is thinning out (effacing). ? Whether it is opening up (dilating). What happens during labor and delivery?  Normal labor and delivery is divided into the following three stages: Stage 1  This is the longest stage of labor.  This stage can last for hours or days.  Throughout this stage, you will feel contractions. Contractions generally feel mild, infrequent, and irregular at first. They get stronger, more frequent (about every 2-3 minutes), and more regular as you move through this stage.  This stage ends when your cervix is completely dilated to 4 inches (10 cm) and completely effaced. Stage 2  This stage starts once your cervix is completely effaced and dilated and lasts until the delivery of your baby.  This stage may last from 20 minutes to 2 hours.  This is the stage where you will feel an urge to push your baby out of your vagina.  You may feel stretching and burning pain, especially when the widest part of your baby's head passes through the vaginal opening (crowning).  Once your baby is delivered, the umbilical cord will be clamped and cut. This usually occurs after waiting a period of 1-2 minutes after delivery.  Your baby will be placed on your bare chest (skin-to-skin contact) in an upright position and covered with a warm blanket. Watch your baby for feeding cues, like rooting or sucking, and help the baby to your breast for his or her first feeding. Stage 3  This stage starts immediately after the birth of your baby and ends after you deliver the placenta.  This stage may take anywhere from 5 to 30 minutes.  After your baby has been delivered, you will feel contractions as your body expels the placenta and your uterus contracts to control bleeding. What can I expect  after labor and delivery?  After labor is over, you and your baby will be monitored closely until you are ready to go home to ensure that you are both healthy. Your health care team will teach you how to care for yourself and your baby.  You and your baby will stay in the same room (rooming in) during your hospital stay. This will encourage early bonding and successful breastfeeding.  You may continue to receive fluids and medicines through an  IV.  Your uterus will be checked and massaged regularly (fundal massage).  You will have some soreness and pain in your abdomen, vagina, and the area of skin between your vaginal opening and your anus (perineum).  If an incision was made near your vagina (episiotomy) or if you had some vaginal tearing during delivery, cold compresses may be placed on your episiotomy or your tear. This helps to reduce pain and swelling.  You may be given a squirt bottle to use instead of wiping when you go to the bathroom. To use the squirt bottle, follow these steps: ? Before you urinate, fill the squirt bottle with warm water. Do not use hot water. ? After you urinate, while you are sitting on the toilet, use the squirt bottle to rinse the area around your urethra and vaginal opening. This rinses away any urine and blood. ? Fill the squirt bottle with clean water every time you use the bathroom.  It is normal to have vaginal bleeding after delivery. Wear a sanitary pad for vaginal bleeding and discharge. Summary  Vaginal delivery means that you will give birth by pushing your baby out of your birth canal (vagina).  Your health care provider may monitor your contractions (uterine monitoring) and your baby's heart rate (fetal monitoring).  Your health care provider may perform a physical exam.  Normal labor and delivery is divided into three stages.  After labor is over, you and your baby will be monitored closely until you are ready to go home. This information  is not intended to replace advice given to you by your health care provider. Make sure you discuss any questions you have with your health care provider. Document Revised: 01/07/2018 Document Reviewed: 01/07/2018 Elsevier Patient Education  2020 [employer].

## 2020-09-29 ENCOUNTER — Encounter: Payer: Self-pay | Admitting: Obstetrics and Gynecology

## 2020-09-29 ENCOUNTER — Other Ambulatory Visit: Payer: Self-pay

## 2020-09-29 ENCOUNTER — Inpatient Hospital Stay
Admission: EM | Admit: 2020-09-29 | Discharge: 2020-10-01 | DRG: 807 | Disposition: A | Discharge status: Home / Self Care | Payer: Medicaid Other | Attending: Obstetrics and Gynecology | Admitting: Obstetrics and Gynecology

## 2020-09-29 ENCOUNTER — Inpatient Hospital Stay: Payer: Medicaid Other | Admitting: Anesthesiology

## 2020-09-29 DIAGNOSIS — Z3490 Encounter for supervision of normal pregnancy, unspecified, unspecified trimester: Secondary | ICD-10-CM | POA: Diagnosis present

## 2020-09-29 DIAGNOSIS — Z20822 Contact with and (suspected) exposure to covid-19: Secondary | ICD-10-CM | POA: Diagnosis present

## 2020-09-29 DIAGNOSIS — O4103X0 Oligohydramnios, third trimester, not applicable or unspecified: Principal | ICD-10-CM | POA: Diagnosis present

## 2020-09-29 DIAGNOSIS — O99214 Obesity complicating childbirth: Secondary | ICD-10-CM | POA: Diagnosis present

## 2020-09-29 DIAGNOSIS — O26893 Other specified pregnancy related conditions, third trimester: Secondary | ICD-10-CM | POA: Diagnosis present

## 2020-09-29 DIAGNOSIS — Z3A39 39 weeks gestation of pregnancy: Secondary | ICD-10-CM | POA: Diagnosis not present

## 2020-09-29 DIAGNOSIS — O99213 Obesity complicating pregnancy, third trimester: Secondary | ICD-10-CM | POA: Diagnosis present

## 2020-09-29 LAB — RESPIRATORY PANEL BY RT PCR (FLU A&B, COVID)
Influenza A by PCR: NEGATIVE
Influenza B by PCR: NEGATIVE
SARS Coronavirus 2 by RT PCR: NEGATIVE

## 2020-09-29 LAB — TYPE AND SCREEN
ABO/RH(D): O POS
Antibody Screen: NEGATIVE

## 2020-09-29 LAB — CBC
HCT: 34.1 % — ABNORMAL LOW (ref 36.0–46.0)
Hemoglobin: 10.8 g/dL — ABNORMAL LOW (ref 12.0–15.0)
MCH: 26.6 pg (ref 26.0–34.0)
MCHC: 31.7 g/dL (ref 30.0–36.0)
MCV: 84 fL (ref 80.0–100.0)
Platelets: 236 10*3/uL (ref 150–400)
RBC: 4.06 MIL/uL (ref 3.87–5.11)
RDW: 15.1 % (ref 11.5–15.5)
WBC: 6.8 10*3/uL (ref 4.0–10.5)
nRBC: 0 % (ref 0.0–0.2)

## 2020-09-29 MED ORDER — OXYTOCIN BOLUS FROM INFUSION
333.0000 mL | Freq: Once | INTRAVENOUS | Status: AC
  Administered 2020-09-30: 333 mL via INTRAVENOUS

## 2020-09-29 MED ORDER — EPHEDRINE 5 MG/ML INJ: 10.0000 mg | INTRAVENOUS | Status: DC | PRN

## 2020-09-29 MED ORDER — LACTATED RINGERS IV SOLN
500.0000 mL | INTRAVENOUS | Status: DC | PRN
  Administered 2020-09-29: 500 mL via INTRAVENOUS

## 2020-09-29 MED ORDER — AMMONIA AROMATIC IN INHA
RESPIRATORY_TRACT | Status: AC
  Filled 2020-09-29: qty 10

## 2020-09-29 MED ORDER — LIDOCAINE HCL (PF) 1 % IJ SOLN: 30.0000 mL | INTRAMUSCULAR | Status: DC | PRN

## 2020-09-29 MED ORDER — BUTORPHANOL TARTRATE 1 MG/ML IJ SOLN: 1.0000 mg | INTRAMUSCULAR | Status: DC | PRN

## 2020-09-29 MED ORDER — OXYTOCIN 10 UNIT/ML IJ SOLN
INTRAMUSCULAR | Status: AC
  Filled 2020-09-29: qty 2

## 2020-09-29 MED ORDER — MISOPROSTOL 200 MCG PO TABS
ORAL_TABLET | ORAL | Status: AC
  Filled 2020-09-29: qty 4

## 2020-09-29 MED ORDER — DIPHENHYDRAMINE HCL 50 MG/ML IJ SOLN: 12.5000 mg | INTRAMUSCULAR | Status: DC | PRN

## 2020-09-29 MED ORDER — TERBUTALINE SULFATE 1 MG/ML IJ SOLN: 0.2500 mg | Freq: Once | INTRAMUSCULAR | Status: DC | PRN

## 2020-09-29 MED ORDER — PHENYLEPHRINE 40 MCG/ML (10ML) SYRINGE FOR IV PUSH (FOR BLOOD PRESSURE SUPPORT): 80.0000 ug | PREFILLED_SYRINGE | INTRAVENOUS | Status: DC | PRN

## 2020-09-29 MED ORDER — FENTANYL 2.5 MCG/ML W/ROPIVACAINE 0.15% IN NS 100 ML EPIDURAL (ARMC)
EPIDURAL | Status: AC
  Administered 2020-09-30: 12 mL/h via EPIDURAL
  Filled 2020-09-29: qty 100

## 2020-09-29 MED ORDER — SODIUM CHLORIDE 0.9 % IV SOLN
INTRAVENOUS | Status: DC | PRN
  Administered 2020-09-29: 8 mL via EPIDURAL

## 2020-09-29 MED ORDER — ONDANSETRON HCL 4 MG/2ML IJ SOLN
4.0000 mg | Freq: Four times a day (QID) | INTRAMUSCULAR | Status: DC | PRN
  Administered 2020-09-30: 4 mg via INTRAVENOUS
  Filled 2020-09-29: qty 2

## 2020-09-29 MED ORDER — OXYTOCIN-SODIUM CHLORIDE 30-0.9 UT/500ML-% IV SOLN
1.0000 m[IU]/min | INTRAVENOUS | Status: DC
  Administered 2020-09-29: 10 m[IU]/min via INTRAVENOUS
  Administered 2020-09-29: 2 m[IU]/min via INTRAVENOUS
  Filled 2020-09-29: qty 1000

## 2020-09-29 MED ORDER — MISOPROSTOL 25 MCG QUARTER TABLET
25.0000 ug | ORAL_TABLET | ORAL | Status: DC | PRN
  Filled 2020-09-29: qty 1

## 2020-09-29 MED ORDER — OXYTOCIN-SODIUM CHLORIDE 30-0.9 UT/500ML-% IV SOLN
2.5000 [IU]/h | INTRAVENOUS | Status: DC
  Administered 2020-09-30: 2.5 [IU]/h via INTRAVENOUS

## 2020-09-29 MED ORDER — LACTATED RINGERS IV SOLN
500.0000 mL | Freq: Once | INTRAVENOUS | Status: AC
  Administered 2020-09-29: 500 mL via INTRAVENOUS

## 2020-09-29 MED ORDER — LIDOCAINE HCL (PF) 1 % IJ SOLN
INTRAMUSCULAR | Status: DC | PRN
  Administered 2020-09-29: 3 mL

## 2020-09-29 MED ORDER — LIDOCAINE HCL (PF) 1 % IJ SOLN
INTRAMUSCULAR | Status: AC
  Filled 2020-09-29: qty 30

## 2020-09-29 MED ORDER — LACTATED RINGERS IV SOLN: INTRAVENOUS | Status: DC

## 2020-09-29 MED ORDER — FENTANYL 2.5 MCG/ML W/ROPIVACAINE 0.15% IN NS 100 ML EPIDURAL (ARMC)
EPIDURAL | Status: DC | PRN
Start: 2020-09-29 — End: 2020-09-30
  Administered 2020-09-29: 12 mL/h via EPIDURAL

## 2020-09-29 MED ORDER — LIDOCAINE-EPINEPHRINE (PF) 1.5 %-1:200000 IJ SOLN
INTRAMUSCULAR | Status: DC | PRN
  Administered 2020-09-29: 3 mL via EPIDURAL

## 2020-09-29 MED ORDER — ACETAMINOPHEN 325 MG PO TABS: 650.0000 mg | ORAL_TABLET | ORAL | Status: DC | PRN

## 2020-09-29 MED ORDER — FENTANYL 2.5 MCG/ML W/ROPIVACAINE 0.15% IN NS 100 ML EPIDURAL (ARMC)
12.0000 mL/h | EPIDURAL | Status: DC
  Administered 2020-09-29: 12 mL/h via EPIDURAL
  Filled 2020-09-29: qty 100

## 2020-09-29 NOTE — Anesthesia Preprocedure Evaluation (Signed)
Anesthesia Evaluation  Patient identified by MRN, date of birth, ID band Patient awake    Reviewed: Allergy & Precautions, NPO status , Patient's Chart, lab work & pertinent test results  History of Anesthesia Complications Negative for: history of anesthetic complications  Airway Mallampati: III  TM Distance: >3 FB Neck ROM: Full    Dental no notable dental hx. (+) Teeth Intact   Pulmonary neg pulmonary ROS, neg sleep apnea, neg COPD, Patient abstained from smoking.Not current smoker,    Pulmonary exam normal breath sounds clear to auscultation       Cardiovascular Exercise Tolerance: Good METS(-) hypertension(-) CAD and (-) Past MI negative cardio ROS  (-) dysrhythmias  Rhythm:Regular Rate:Normal - Systolic murmurs    Neuro/Psych negative neurological ROS  negative psych ROS   GI/Hepatic neg GERD  ,(+)     (-) substance abuse  ,   Endo/Other  neg diabetesMorbid obesity  Renal/GU negative Renal ROS     Musculoskeletal   Abdominal (+) + obese,   Peds  Hematology   Anesthesia Other Findings Past Medical History: No date: Cervical dysplasia No date: Obesity No date: Vaginal Pap smear, abnormal  Reproductive/Obstetrics (+) Pregnancy                             Anesthesia Physical Anesthesia Plan  ASA: III  Anesthesia Plan: Epidural   Post-op Pain Management:    Induction:   PONV Risk Score and Plan: 2 and Treatment may vary due to age or medical condition and Ondansetron  Airway Management Planned: Natural Airway  Additional Equipment:   Intra-op Plan:   Post-operative Plan:   Informed Consent: I have reviewed the patients History and Physical, chart, labs and discussed the procedure including the risks, benefits and alternatives for the proposed anesthesia with the patient or authorized representative who has indicated his/her understanding and acceptance.        Plan Discussed with: Surgeon  Anesthesia Plan Comments: (Discussed R/B/A of neuraxial anesthesia technique with patient: - rare risks of spinal/epidural hematoma, nerve damage, infection - Risk of PDPH - Risk of itching - Risk of nausea and vomiting - Risk of poor block necessitating replacement of epidural. Patient voiced understanding.)        Anesthesia Quick Evaluation

## 2020-09-29 NOTE — H&P (Signed)
History and Physical Interval Note:  09/29/2020 11:12 AM  Adrienne Clarke  has presented today for INDUCTION OF LABOR (pitocin),  with the diagnosis of Elective. The various methods of treatment have been discussed with the patient and family. After consideration of risks, benefits and other options for treatment, the patient has consented to  Labor induction .  The patient's history has been reviewed, patient examined, no change in status, and is stable for induction as planned.  See H&P. I have reviewed the patient's chart and labs.  Questions were answered to the patient's satisfaction.    Vital Signs: BP 123/77 (BP Location: Left Arm)   Pulse 85   Temp 98.3 F (36.8 C) (Oral)   Resp 16   Ht 5\' 5"  (1.651 m)   Wt 128.4 kg   LMP 12/28/2019 (Exact Date) Comment: abnormal  BMI 47.09 kg/m  Constitutional: Well nourished, well developed female in no acute distress.  HEENT: normal Skin: Warm and dry.  Cardiovascular: Regular rate and rhythm.   Extremity: no edema  Respiratory: Clear to auscultation bilateral. Normal respiratory effort Abdomen: FHT present Back: no CVAT Neuro: DTRs 2+, Cranial nerves grossly intact Psych: Alert and Oriented x3. No memory deficits. Normal mood and affect.  MS: normal gait, normal bilateral lower extremity ROM/strength/stability.  Pelvic exam: (female chaperone present) is not limited by body habitus EGBUS: within normal limits Vagina: within normal limits and with normal mucosa Cervix: closed (scar tissue from LEEP)/80/-2, attempted manipulation of scar tissue  Start pitocin   Rod Can, [facility] 09/29/2020  11:12 AM

## 2020-09-29 NOTE — Progress Notes (Signed)
Pt presents to L&D for scheduled Induction of labor. Pt denies ctx, leaking of fluid, or vaginal bleeding. Charlsie Merles placed orders.

## 2020-09-29 NOTE — Anesthesia Procedure Notes (Signed)
Epidural Patient location during procedure: OB Start time: 09/29/2020 7:41 PM End time: 09/29/2020 8:00 PM  Staffing Anesthesiologist: Arita Miss, MD Performed: anesthesiologist   Preanesthetic Checklist Completed: patient identified, IV checked, site marked, risks and benefits discussed, surgical consent, monitors and equipment checked, pre-op evaluation and timeout performed  Epidural Patient position: sitting Prep: ChloraPrep Patient monitoring: heart rate, continuous pulse ox and blood pressure Approach: midline Location: L3-L4 Injection technique: LOR saline  Needle:  Needle type: Tuohy  Needle gauge: 17 G Needle length: 9 cm and 9 Needle insertion depth: 8.5 cm Catheter type: closed end flexible Catheter size: 19 Gauge Catheter at skin depth: 14 cm Test dose: negative and 1.5% lidocaine with Epi 1:200 K  Assessment Sensory level: T10 Events: blood not aspirated, injection not painful, no injection resistance, no paresthesia and negative IV test  Additional Notes one attempt Pt. Evaluated and documentation done after procedure finished. Patient identified. Risks/Benefits/Options discussed with patient including but not limited to bleeding, infection, nerve damage, paralysis, failed block, incomplete pain control, headache, blood pressure changes, nausea, vomiting, reactions to medication both or allergic, itching and postpartum back pain. Confirmed with bedside nurse the patient's most recent platelet count. Confirmed with patient that they are not currently taking any anticoagulation, have any bleeding history or any family history of bleeding disorders. Patient expressed understanding and wished to proceed. All questions were answered. Sterile technique was used throughout the entire procedure. Please see nursing notes for vital signs. Test dose was given through epidural catheter and negative prior to continuing to dose epidural or start infusion. Warning signs of high  block given to the patient including shortness of breath, tingling/numbness in hands, complete motor block, or any concerning symptoms with instructions to call for help. Patient was given instructions on fall risk and not to get out of bed. All questions and concerns addressed with instructions to call with any issues or inadequate analgesia.   Patient tolerated the insertion well without immediate complications.Reason for block:procedure for pain

## 2020-09-29 NOTE — Progress Notes (Signed)
  Labor Progress Note   34 y.o. [name] , admitted for  Pregnancy, Labor Management.   Subjective:  Coping well with contractions  Objective:  BP 117/80 (BP Location: Left Arm)   Pulse 89   Temp 98.4 F (36.9 C) (Oral)   Resp 16   Ht 5\' 5"  (1.651 m)   Wt 128.4 kg   LMP 12/28/2019 (Exact Date) Comment: abnormal  BMI 47.09 kg/m  Abd: gravid, ND, FHT present, mild tenderness on exam Extr: no edema SVE: CERVIX: 0 cm dilated, 80-90 effaced, -2 station, scar tissue manipulation  EFM: FHR: 150 bpm, variability: moderate,  accelerations:  Present,  decelerations:  Absent Toco: Frequency: Every 2-3 minutes Labs: I have reviewed the patient's lab results.   Assessment & Plan:  [name], admitted for  Pregnancy and Labor/Delivery Management  1. Pain management: position changes. 2. FWB: FHT category I.  3. ID: GBS negative 4. Labor management: continue pitocin titration  All discussed with patient, see orders   Rod Can, [facility] 09/29/2020  12:57 PM

## 2020-09-29 NOTE — H&P (Signed)
@LOGO @ History and Physical  Adrienne Clarke is a 34 y.o. 1948 [redacted]w[redacted]d  for Induction of Labor scheduled due to Favorable cervix at term .   See labor record for pregnancy highlights.  No recent pain, bleeding, ruptured membranes, or other signs of progressing labor.  PMHx: She  has a past medical history of Cervical dysplasia, Obesity, and Vaginal Pap smear, abnormal. Also,  has a past surgical history that includes Cervical biopsy w/ loop electrode excision and Colposcopy., family history includes AAA (abdominal aortic aneurysm) in her maternal grandfather.,  reports that she has never smoked. She has never used smokeless tobacco. She reports that she does not drink alcohol and does not use drugs. She @CMEDP @ Also, has No Known Allergies. OB History  Gravida Para Term Preterm AB Living  4 3 3     3   SAB TAB Ectopic Multiple Live Births        0 3    # Outcome Date GA Lbr Len/2nd Weight Sex Delivery Anes PTL Lv  4 Current           3 Term 11/05/15 [redacted]w[redacted]d 04:56 / 00:15 3235 g F Vag-Spont None  LIV  2 Term 04/13/06 [redacted]w[redacted]d  4082 g M Vag-Spont None  LIV  1 Term 06/27/03 104w1d  3402 g F Vag-Spont EPI  LIV  Patient denies any other pertinent gynecologic issues.   Review of Systems  Constitutional: Negative.   HENT: Negative.   Eyes: Negative.   Respiratory: Negative.   Cardiovascular: Negative.   Gastrointestinal: Negative.   Genitourinary: Negative.   Musculoskeletal: Negative.   Skin: Negative.   Neurological: Negative.   Endo/Heme/Allergies: Negative.   Psychiatric/Behavioral: Negative.     Objective: LMP 12/28/2019 (Exact Date) Comment: abnormal Physical Exam Constitutional:      Appearance: Normal appearance. She is obese.  Genitourinary:     Vulva normal.  HENT:     Mouth/Throat:     Mouth: Mucous membranes are moist.     Pharynx: Oropharynx is clear.  Eyes:     Pupils: Pupils are equal, round, and reactive to light.  Cardiovascular:     Rate and Rhythm: Normal rate  and regular rhythm.     Pulses: Normal pulses.     Heart sounds: Normal heart sounds.  Pulmonary:     Effort: Pulmonary effort is normal.     Breath sounds: Normal breath sounds.  Abdominal:     General: Bowel sounds are normal.     Palpations: Abdomen is soft.     Comments: Gravid adipose  Musculoskeletal:        General: Normal range of motion.     Cervical back: Normal range of motion and neck supple.  Neurological:     General: No focal deficit present.     Mental Status: She is alert and oriented to person, place, and time.  Skin:    General: Skin is warm and dry.  Psychiatric:        Mood and Affect: Mood normal.        Behavior: Behavior normal.     Assessment: Term Pregnancy for Induction of Labor due to Favorable cervix at term and and low amniotic fluid.  Plan: Patient will undergo induction of labor with cervical ripening agents, pitocin.     Patient has been fully informed of the pros and cons, risks and benefits of continued observation with fetal monitoring versus that of induction of labor.   She understands that there are uncommon risks  to induction, which include but are not limited to : frequent or prolonged uterine contractions, fetal distress, uterine rupture, and lack of successful induction.  These risks include all methods including Pitocin and Misoprostol and Cervadil.  Patient understands that using Misoprostol for labor induction is an "off label" indication although it has been studied extensively for this purpose and is an accepted method of induction.  She also has been informed of the increased risks for Cesarean with induction and should induction not be successful.  Patient consents to the induction plan of management.  Plans to breast feed Plans Nexplanon for contraception TDaP UTDyes- given 07/27/20  [name], CNM  09/29/2020 6:38 AM   [facility] 09/29/2020  6:36 AM

## 2020-09-29 NOTE — Progress Notes (Signed)
Pitocin infusion stopped at 1810 per C.Schuman, MD. Pt will eat a light regular diet and Pitocin will restart once pt is done eating.

## 2020-09-29 NOTE — Progress Notes (Signed)
History of LEEP with cervical scarring.  Patient has not made cervical change.  Epidural placed for pain control and pelvic exam performed.  Tried with hemostat to break cervical scar tissue.  Was able to finally break cervical scar tissue with finger exam.  Rupture of membranes at the same time. Clear fluid.  Placed Cooks catheter. Inflated both balloons with 60 mLs of fluid each.  Patient tolerated procedure well.   Adrian Prows MD [facility] 09/29/2020 10:38 PM

## 2020-09-30 ENCOUNTER — Encounter: Payer: Self-pay | Admitting: Obstetrics and Gynecology

## 2020-09-30 DIAGNOSIS — Z3A39 39 weeks gestation of pregnancy: Secondary | ICD-10-CM

## 2020-09-30 LAB — RPR: RPR Ser Ql: NONREACTIVE

## 2020-09-30 MED ORDER — ONDANSETRON HCL 4 MG PO TABS
4.0000 mg | ORAL_TABLET | ORAL | Status: DC | PRN
  Filled 2020-09-30: qty 1

## 2020-09-30 MED ORDER — ONDANSETRON HCL 4 MG/2ML IJ SOLN: 4.0000 mg | INTRAMUSCULAR | Status: DC | PRN

## 2020-09-30 MED ORDER — PRENATAL MULTIVITAMIN CH: 1.0000 | ORAL_TABLET | Freq: Every day | ORAL | Status: DC

## 2020-09-30 MED ORDER — METHYLERGONOVINE MALEATE 0.2 MG/ML IJ SOLN
INTRAMUSCULAR | Status: AC
  Administered 2020-09-30: 0.2 mg via INTRAMUSCULAR
  Filled 2020-09-30: qty 1

## 2020-09-30 MED ORDER — DIPHENHYDRAMINE HCL 25 MG PO CAPS: 25.0000 mg | ORAL_CAPSULE | Freq: Four times a day (QID) | ORAL | Status: DC | PRN

## 2020-09-30 MED ORDER — DIBUCAINE (PERIANAL) 1 % EX OINT: 1.0000 "application " | TOPICAL_OINTMENT | CUTANEOUS | Status: DC | PRN

## 2020-09-30 MED ORDER — METHYLERGONOVINE MALEATE 0.2 MG/ML IJ SOLN: 0.2000 mg | Freq: Once | INTRAMUSCULAR | Status: AC

## 2020-09-30 MED ORDER — IBUPROFEN 600 MG PO TABS
600.0000 mg | ORAL_TABLET | Freq: Four times a day (QID) | ORAL | Status: DC
  Administered 2020-09-30 – 2020-10-01 (×3): 600 mg via ORAL
  Filled 2020-09-30 (×3): qty 1

## 2020-09-30 MED ORDER — DOCUSATE SODIUM 100 MG PO CAPS
100.0000 mg | ORAL_CAPSULE | Freq: Two times a day (BID) | ORAL | Status: DC
  Administered 2020-09-30: 100 mg via ORAL
  Filled 2020-09-30: qty 1

## 2020-09-30 MED ORDER — ZOLPIDEM TARTRATE 5 MG PO TABS: 5.0000 mg | ORAL_TABLET | Freq: Every evening | ORAL | Status: DC | PRN

## 2020-09-30 MED ORDER — COCONUT OIL OIL: 1.0000 "application " | TOPICAL_OIL | Status: DC | PRN

## 2020-09-30 MED ORDER — BENZOCAINE-MENTHOL 20-0.5 % EX AERO
1.0000 "application " | INHALATION_SPRAY | CUTANEOUS | Status: DC | PRN
  Filled 2020-09-30: qty 56

## 2020-09-30 MED ORDER — WITCH HAZEL-GLYCERIN EX PADS: 1.0000 "application " | MEDICATED_PAD | CUTANEOUS | Status: DC | PRN

## 2020-09-30 MED ORDER — ACETAMINOPHEN 500 MG PO TABS
1000.0000 mg | ORAL_TABLET | Freq: Four times a day (QID) | ORAL | Status: DC
  Administered 2020-09-30 – 2020-10-01 (×4): 1000 mg via ORAL
  Filled 2020-09-30 (×4): qty 2

## 2020-09-30 MED ORDER — SIMETHICONE 80 MG PO CHEW: 80.0000 mg | CHEWABLE_TABLET | ORAL | Status: DC | PRN

## 2020-09-30 NOTE — Lactation Note (Addendum)
This note was copied from a baby's chart. Lactation visit to mom and baby in LDR 2, baby is asleep on dad's chest, mom stated she breastfed and formula fed her other children x 6 mths, no problems with latching or milk production per mom.  She desires to do the same with this baby.  LC offered assist with latch and questions prn.

## 2020-09-30 NOTE — Discharge Summary (Signed)
Postpartum Discharge Summary  Date of Service updated10/16/21     Patient Name: Adrienne Clarke DOB: [birth year] MRN: [id number]  Date of admission: 09/29/2020 Delivery date:09/30/2020  Delivering provider: Adrian Prows R  Date of discharge: 10/01/2020  Admitting diagnosis: Encounter for elective induction of labor [Z34.90] Intrauterine pregnancy: [redacted]w[redacted]d    Secondary diagnosis:  Active Problems:   Obesity affecting pregnancy in third trimester   Antepartum oligohydramnios in third trimester, delivered, single or unspecified fetus   Encounter for elective induction of labor  Additional problems: none    Discharge diagnosis: Term Pregnancy Delivered                                              Post partum procedures:none Augmentation: AROM and Pitocin Complications: None  Hospital course: Induction of Labor With Vaginal Delivery   34y.o. yo [id number] 364w4das admitted to the hospital 09/29/2020 for induction of labor.  Indication for induction: Favorable cervix at term.  Patient had an uncomplicated labor course as follows: Membrane Rupture Time/Date: 10:15 PM ,09/29/2020   Delivery Method:Vaginal, Spontaneous  Episiotomy: None  Lacerations:  None  Details of delivery can be found in separate delivery note.  Patient had a routine postpartum course. Patient is discharged home 10/01/20.  Newborn Data: Birth date:09/30/2020  Birth time:7:57 AM  Gender:Female  Living status:Living  Apgars:10 ,10  Weight:3500 g   Magnesium Sulfate received: No BMZ received: No Rhophylac:No MMR:No T-DaP:Given prenatally Flu: N/A Transfusion:No  Physical exam  Vitals:   09/30/20 2342 10/01/20 0434 10/01/20 0726 10/01/20 0733  BP: 120/65 121/78 112/75 112/75  Pulse: 70 80 70 70  Resp: _0 Temp: 98 F (36.7 C) 98.2 F (36.8 C) 98.3 F (36.8 C) 98.3 F (36.8 C)  TempSrc: Oral Oral Oral Oral  SpO2: 100% 100% 100% 100%  Weight:      Height:       General:  alert, cooperative and no distress Lochia: appropriate Uterine Fundus: firm Incision: N/A DVT Evaluation: No evidence of DVT seen on physical exam. Labs: Lab Results  Component Value Date   WBC 8.1 10/01/2020   HGB 9.4 (L) 10/01/2020   HCT 30.6 (L) 10/01/2020   MCV 85.5 10/01/2020   PLT 192 10/01/2020   CMP Latest Ref Rng & Units 06/09/2020  Glucose 70 - 99 mg/dL 99  BUN 6 - 20 mg/dL 7  Creatinine 0.44 - 1.00 mg/dL 0.74  Sodium 135 - 145 mmol/L 136  Potassium 3.5 - 5.1 mmol/L 4.0  Chloride 98 - 111 mmol/L 103  CO2 22 - 32 mmol/L 26  Calcium 8.9 - 10.3 mg/dL 8.9  Total Protein 6.5 - 8.1 g/dL 6.7  Total Bilirubin 0.3 - 1.2 mg/dL 0.5  Alkaline Phos 38 - 126 U/L 50  AST 15 - 41 U/L 12(L)  ALT 0 - 44 U/L 12   Edinburgh Score: Edinburgh Postnatal Depression Scale Screening Tool 09/30/2020  I have been able to laugh and see the funny side of things. 0  I have looked forward with enjoyment to things. 0  I have blamed myself unnecessarily when things went wrong. 1  I have been anxious or worried for no good reason. 0  I have felt scared or panicky for no good reason. 0  Things have been getting on top of me. 0  I  have been so unhappy that I have had difficulty sleeping. 0  I have felt sad or miserable. 0  I have been so unhappy that I have been crying. 0  The thought of harming myself has occurred to me. 0  Edinburgh Postnatal Depression Scale Total 1      After visit meds:  Allergies as of 10/01/2020   No Known Allergies     Medication List    TAKE these medications   folic acid 1 MG tablet Commonly known as: FOLVITE Take 1 tablet (1 mg total) by mouth daily.   Multivitamin Women Tabs Take 1 tablet by mouth 1 day or 1 dose.     TAKE IRON DAILY (over the counter) for 30 days   Discharge home in stable condition Infant Feeding: Bottle and Breast Infant Disposition:home with mother Discharge instruction: per After Visit Summary and Postpartum  booklet. Activity: Advance as tolerated. Pelvic rest for 6 weeks.  Diet: routine diet Anticipated Birth Control: IUD Postpartum Appointment:6 weeks Additional Postpartum F/U: as needed Future Appointments: Future Appointments  Date Time Provider [place]  10/17/2020  8:30 AM [facility] None  10/21/2020  8:40 AM [facility] None   Follow up Visit:  Follow-up Information    Schuman, Stefanie Libel, MD. Schedule an appointment as soon as possible for a visit in 6 week(s).   Specialty: Obstetrics and Gynecology Contact information: [address] [place] [address] [phone]                   10/01/2020 Hoyt Koch, MD

## 2020-09-30 NOTE — Discharge Instructions (Signed)
Discharge Instructions:   Follow-up Appointment: 1 week, please call the office to schedule  If there are any new medications, they have been ordered and will be available for pickup at the listed pharmacy on your way home from the hospital.   Call the office if you have any of the following: headache, visual changes, fever >101.0 F, chills, shortness of breath, breast concerns, excessive vaginal bleeding, incision drainage or problems, leg pain or redness, depression or any other concerns. If you have vaginal discharge with an odor, let your doctor know.   It is normal to bleed for up to 6 weeks. You should not soak through more than 1 pad in 1 hour. If you have a blood clot larger than your fist with continued bleeding, call your doctor.   Activity: Do not lift > 15 lbs for 6 weeks (do not lift anything heavier than your baby). No intercourse, tampons, swimming pools, hot tubs, baths (only showers) for 6 weeks.  No driving for 1-2 weeks. Do not drive while taking narcotic or opioid pain medication.  Continue taking your prenatal vitamin, especially if breastfeeding. Increase calories and fluids (water) while breastfeeding.   Your milk will come in, in the next couple of days (right now it is colostrum). You may have a slight fever when your milk comes in, but it should go away on its own.  If it does not, and rises above 101 F please call the doctor. You will also feel achy and your breasts will be firm. They will also start to leak. If you are breastfeeding, continue as you have been and you can pump/express milk for comfort.   If you have too much milk, your breasts can become engorged, which could lead to mastitis. This is an infection of the milk ducts. It can be very painful and you will need to notify your doctor to obtain a prescription for antibiotics. You can also treat it with a shower or hot/cold compress.   For concerns about your baby, please call your pediatrician.  For  breastfeeding concerns, the lactation consultant can be reached at [phone].   Postpartum blues (feelings of happy one minute and sad another minute) are normal for the first few weeks but if it gets worse let your doctor know.   Congratulations! We enjoyed caring for you and your new bundle of joy!   

## 2020-10-01 LAB — CBC
HCT: 30.6 % — ABNORMAL LOW (ref 36.0–46.0)
Hemoglobin: 9.4 g/dL — ABNORMAL LOW (ref 12.0–15.0)
MCH: 26.3 pg (ref 26.0–34.0)
MCHC: 30.7 g/dL (ref 30.0–36.0)
MCV: 85.5 fL (ref 80.0–100.0)
Platelets: 192 10*3/uL (ref 150–400)
RBC: 3.58 MIL/uL — ABNORMAL LOW (ref 3.87–5.11)
RDW: 15.2 % (ref 11.5–15.5)
WBC: 8.1 10*3/uL (ref 4.0–10.5)
nRBC: 0 % (ref 0.0–0.2)

## 2020-10-01 NOTE — Progress Notes (Signed)
Mother discharged.  Discharge instructions given.  Mother verbalizes understanding.  Transported by auxiliary.

## 2020-10-04 ENCOUNTER — Telehealth: Payer: Self-pay | Admitting: Obstetrics and Gynecology

## 2020-10-04 NOTE — Anesthesia Postprocedure Evaluation (Signed)
Anesthesia Post Note  Patient: Adrienne Clarke  Procedure(s) Performed: AN AD HOC LABOR EPIDURAL  Patient location during evaluation: Mother Baby Anesthesia Type: Epidural Level of consciousness: awake and alert Pain management: pain level controlled Vital Signs Assessment: post-procedure vital signs reviewed and stable Respiratory status: spontaneous breathing, nonlabored ventilation and respiratory function stable Cardiovascular status: stable Postop Assessment: no headache, no backache and epidural receding Anesthetic complications: no   No complications documented.   Last Vitals: There were no vitals filed for this visit.  Last Pain: There were no vitals filed for this visit.               Arita Miss    Patient discharged prior to assessment. Stable vitals in chart, not informed of any complications or complaints.

## 2020-10-04 NOTE — Telephone Encounter (Signed)
Patient is scheduled for paraguard placement for 11/14/20 at 10:30 with CRS

## 2020-10-07 NOTE — Telephone Encounter (Signed)
Noted. Will order to arrive by apt date/time. 

## 2020-10-17 ENCOUNTER — Other Ambulatory Visit
Admission: RE | Admit: 2020-10-17 | Discharge: 2020-10-17 | Disposition: A | Discharge status: Home / Self Care | Payer: Medicaid Other | Source: Ambulatory Visit | Attending: Obstetrics and Gynecology | Admitting: Obstetrics and Gynecology

## 2020-10-21 ENCOUNTER — Other Ambulatory Visit: Admission: RE | Admit: 2020-10-21 | Payer: Medicaid Other | Source: Ambulatory Visit

## 2020-10-31 ENCOUNTER — Telehealth: Payer: Self-pay

## 2020-10-31 NOTE — Telephone Encounter (Signed)
Spoke w/patient. She is an Corporate treasurer at [employer]. She would like to return to work this Friday 11/04/20. She states she is doing well. No complications. She does not do any lifting of patients while at work. Aware CRS out of the office until Wed.

## 2020-10-31 NOTE — Telephone Encounter (Signed)
Patient has 6 wk ppc apt 11/14/20. She would like to return to work sooner than that and is requesting a return to work note. Cb#[phone]

## 2020-11-02 NOTE — Telephone Encounter (Signed)
Letter created/sent. Patient aware.

## 2020-11-02 NOTE — Telephone Encounter (Signed)
okay

## 2020-11-14 ENCOUNTER — Ambulatory Visit: Payer: Medicaid Other | Admitting: Obstetrics and Gynecology

## 2020-11-14 ENCOUNTER — Telehealth: Payer: Self-pay | Admitting: Obstetrics and Gynecology

## 2020-11-14 NOTE — Telephone Encounter (Signed)
Patient is scheduled for 11/21/20 at 3:10 for paraguard placement

## 2020-11-15 NOTE — Telephone Encounter (Signed)
Noted. Paragard reserved for this patient.

## 2020-11-15 NOTE — Telephone Encounter (Signed)
Pt r/s to 11/21/20. See separate message for completion.

## 2020-11-21 ENCOUNTER — Encounter: Payer: Self-pay | Admitting: Obstetrics and Gynecology

## 2020-11-21 ENCOUNTER — Other Ambulatory Visit: Payer: Self-pay

## 2020-11-21 ENCOUNTER — Ambulatory Visit (INDEPENDENT_AMBULATORY_CARE_PROVIDER_SITE_OTHER): Payer: Medicaid Other | Admitting: Obstetrics and Gynecology

## 2020-11-21 VITALS — BP 128/72 | Ht 65.0 in | Wt 271.0 lb

## 2020-11-21 DIAGNOSIS — Z3040 Encounter for surveillance of contraceptives, unspecified: Secondary | ICD-10-CM | POA: Diagnosis not present

## 2020-11-21 DIAGNOSIS — Z30430 Encounter for insertion of intrauterine contraceptive device: Secondary | ICD-10-CM | POA: Diagnosis not present

## 2020-11-21 LAB — POCT URINE PREGNANCY: Preg Test, Ur: NEGATIVE

## 2020-11-21 NOTE — Progress Notes (Signed)
IUD PLACEMENT

## 2020-11-21 NOTE — Progress Notes (Signed)
  OBSTETRICS POSTPARTUM CLINIC PROGRESS NOTE  Subjective:     Adrienne Clarke is a 34 y.o. [id number] female who presents for a postpartum visit. She is 6 weeks postpartum following a Term pregnancy and delivery by Vaginal, no problems at delivery.  I have fully reviewed the prenatal and intrapartum course. Anesthesia: epidural.  Postpartum course has been complicated by uncomplicated.  Baby is feeding by Bottle.  Bleeding: patient has not  resumed menses.  Bowel function is normal. Bladder function is normal.  Contraception method desired is IUD.  Postpartum depression screening: negative.  The following portions of the patient's history were reviewed and updated as appropriate: allergies, current medications, past family history, past medical history, past social history, past surgical history and problem list.  Review of Systems Pertinent items are noted in HPI.  Objective:    BP 128/72   Ht 5\' 5"  (1.651 m)   Wt 271 lb (122.9 kg)   Breastfeeding No   BMI 45.10 kg/m   General:  alert and no distress   Breasts:  inspection negative, no nipple discharge or bleeding, no masses or nodularity palpable  Lungs: clear to auscultation bilaterally  Heart:  regular rate and rhythm, S1, S2 normal, no murmur, click, rub or gallop  Abdomen: soft, non-tender; bowel sounds normal; no masses,  no organomegaly.     Vulva:  normal  Vagina: normal vagina, no discharge, exudate, lesion, or erythema  Cervix:  no cervical motion tenderness and no lesions  Corpus: normal size, contour, position, consistency, mobility, non-tender  Adnexa:  normal adnexa and no mass, fullness, tenderness  Rectal Exam: Not performed.        IUD PROCEDURE NOTE:  Adrienne Clarke is a 34 y.o. [id number] here for IUD insertion. No GYN concerns.  Last pap smear was normal.  IUD Insertion Procedure Note Patient identified, informed consent performed, consent signed.   Discussed risks of irregular bleeding, cramping, infection,  malpositioning or misplacement of the IUD outside the uterus which may require further procedure such as laparoscopy, risk of failure <1%. Time out was performed.  Urine pregnancy test negative.  A bimanual exam showed the uterus to be anteverted.  Speculum placed in the vagina.  Cervix visualized.  Cleaned with Betadine x 2.  Grasped anteriorly with a single tooth tenaculum.  Uterus sounded to 9 cm.   Paragard IUD placed per manufacturer's recommendations.  Strings trimmed to 3 cm. Tenaculum was removed, good hemostasis noted.  Patient tolerated procedure well.   Patient was given post-procedure instructions.  She was advised to have backup contraception for one week.  Patient was also asked to check IUD strings periodically and follow up in 4 weeks for IUD check.    Assessment:  Post Partum Care visit 1. Encounter for surveillance of contraceptive device   - POCT urine pregnancy   Plan:  See orders and Patient Instructions Follow up in: 4 weeks or as needed.   Adrian Prows MD, [name], [facility] 11/21/2020 10:09 PM

## 2020-11-21 NOTE — Patient Instructions (Signed)

## 2020-12-19 ENCOUNTER — Ambulatory Visit: Payer: Medicaid Other | Admitting: Obstetrics and Gynecology

## 2020-12-27 ENCOUNTER — Ambulatory Visit (INDEPENDENT_AMBULATORY_CARE_PROVIDER_SITE_OTHER): Payer: Medicaid Other | Admitting: Obstetrics and Gynecology

## 2020-12-27 ENCOUNTER — Other Ambulatory Visit: Payer: Self-pay

## 2020-12-27 ENCOUNTER — Encounter: Payer: Self-pay | Admitting: Obstetrics and Gynecology

## 2020-12-27 VITALS — BP 128/70 | Ht 65.0 in | Wt 269.2 lb

## 2020-12-27 DIAGNOSIS — Z30431 Encounter for routine checking of intrauterine contraceptive device: Secondary | ICD-10-CM

## 2020-12-27 NOTE — Progress Notes (Signed)
  History of Present Illness:  Adrienne Clarke is a 35 y.o. that had a Paragard IUD placed approximately 4 weeks ago. Since that time, she states that she has had little bleeding and pain  PMHx: She  has a past medical history of Cervical dysplasia, Obesity, and Vaginal Pap smear, abnormal. Also,  has a past surgical history that includes Cervical biopsy w/ loop electrode excision and Colposcopy., family history includes AAA (abdominal aortic aneurysm) in her maternal grandfather.,  reports that she has never smoked. She has never used smokeless tobacco. She reports that she does not drink alcohol and does not use drugs. No outpatient medications have been marked as taking for the 12/27/20 encounter (Office Visit) with Homero Fellers, MD.  .  Also, has No Known Allergies..  Review of Systems  Constitutional: Negative for chills, fever, malaise/fatigue and weight loss.  HENT: Negative for congestion, hearing loss and sinus pain.   Eyes: Negative for blurred vision and double vision.  Respiratory: Negative for cough, sputum production, shortness of breath and wheezing.   Cardiovascular: Negative for chest pain, palpitations, orthopnea and leg swelling.  Gastrointestinal: Negative for abdominal pain, constipation, diarrhea, nausea and vomiting.  Genitourinary: Negative for dysuria, flank pain, frequency, hematuria and urgency.  Musculoskeletal: Negative for back pain, falls and joint pain.  Skin: Negative for itching and rash.  Neurological: Negative for dizziness and headaches.  Psychiatric/Behavioral: Negative for depression, substance abuse and suicidal ideas. The patient is not nervous/anxious.    Physical Exam:  BP 128/70   Ht 5\' 5"  (1.651 m)   Wt 269 lb 3.2 oz (122.1 kg)   BMI 44.80 kg/m  Body mass index is 44.8 kg/m. Constitutional: Well nourished, well developed female in no acute distress.  Abdomen: diffusely non tender to palpation, non distended, and no masses,  hernias Neuro: Grossly intact Psych:  Normal mood and affect.    Pelvic exam:  Two IUD strings present seen coming from the cervical os. EGBUS, vaginal vault and cervix: within normal limits  Assessment: IUD strings present in proper location; pt doing well  Plan: She was told to continue to use barrier contraception, in order to prevent any STIs, and to take a home pregnancy test or call us if she ever thinks she may be pregnant, and that her IUD expires in 10 years.  She was amenable to this plan and we will see her back in 1 year/PRN.  A total of 20 minutes were spent face-to-face with the patient as well as preparation, review, communication, and documentation during this encounter.   Barnett Applebaum, MD, [name], [facility] 12/27/2020  11:09 AM

## 2021-04-10 IMAGING — US US OB FOLLOW-UP
1 series · 13 of 28 positions shown · non-contrast
Comparison: none

CLINICAL DATA: Fetal anatomy evaluation

EXAM:
OBSTETRICAL ULTRASOUND >14 WKS

[Series 1: us ob follow up · 51 acquisitions, 13 frames shown]
[im 2/51]
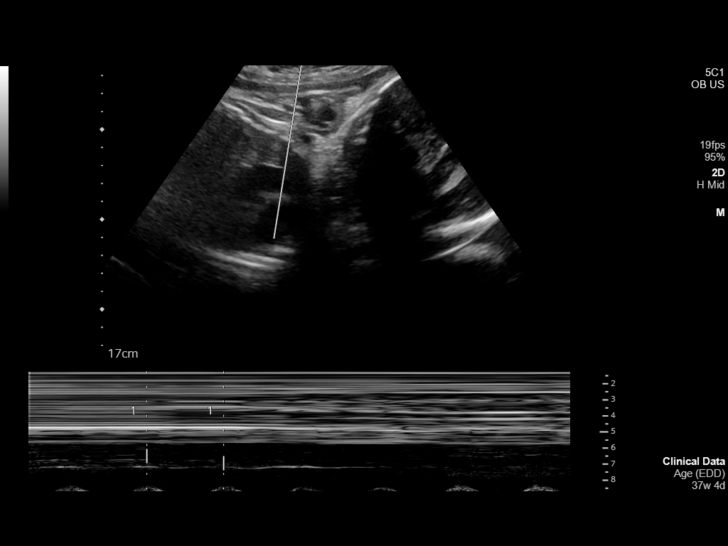
[im 6/51]
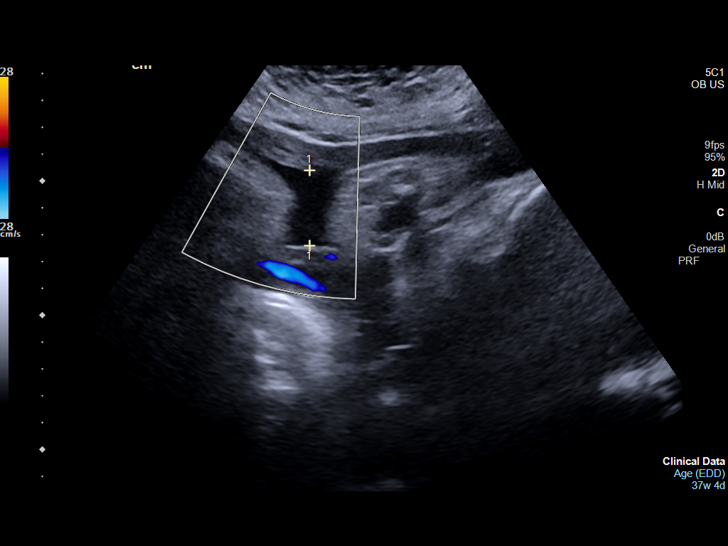
[im 10/51]
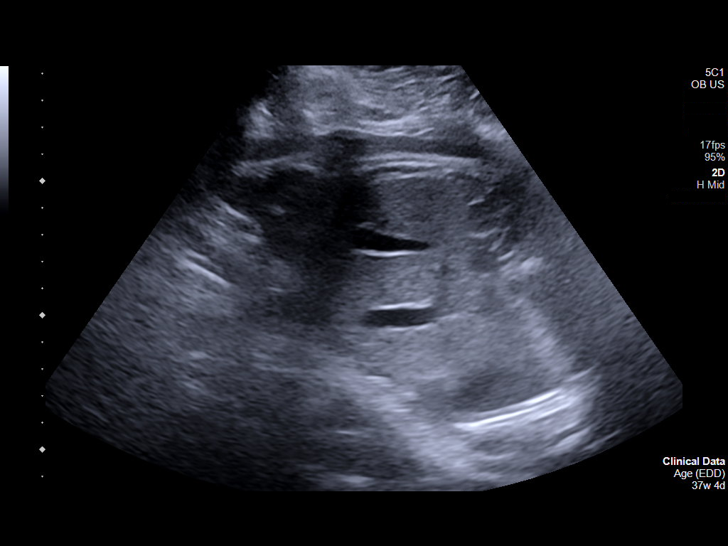
[im 13/51]
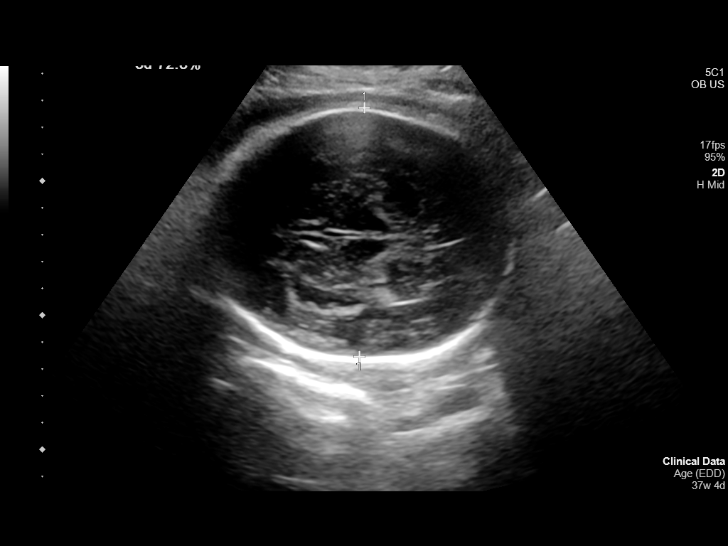
[im 17/51]
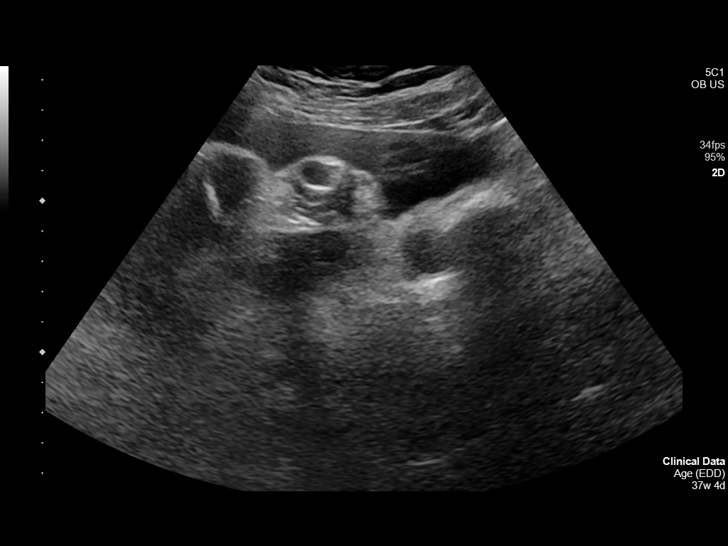
[im 21/51]
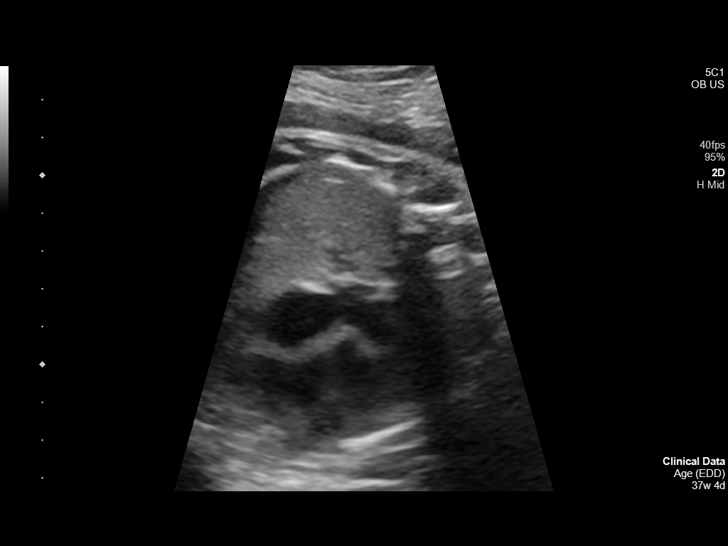
[im 26/51]
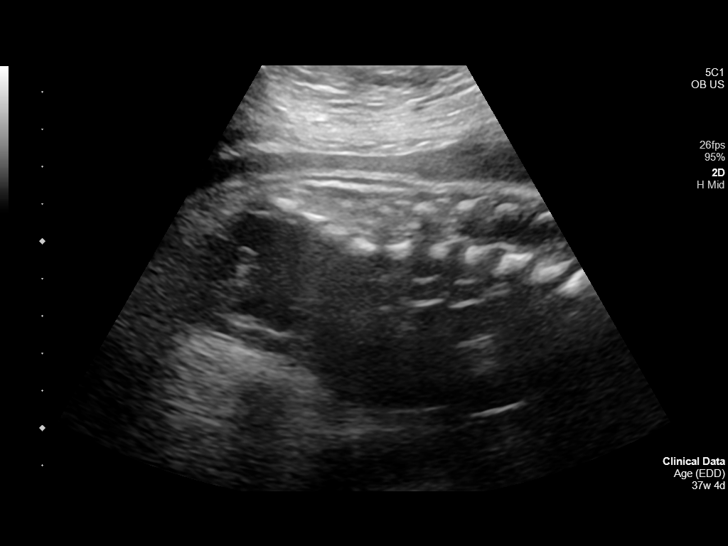
[im 30/51]
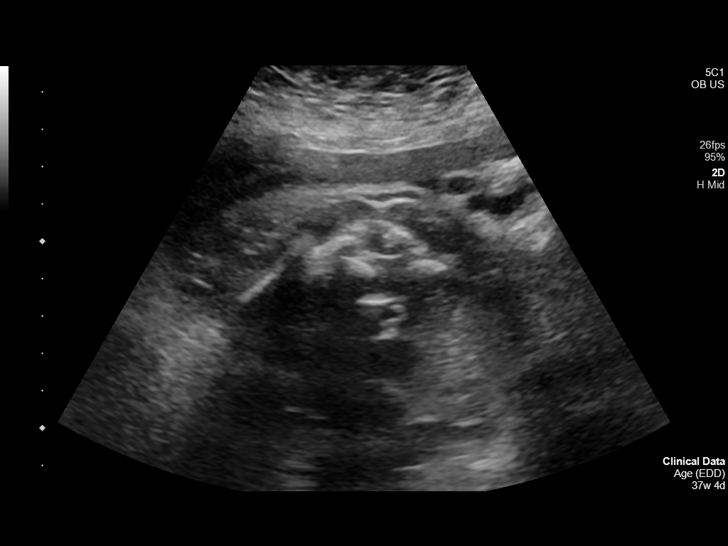
[im 34/51]
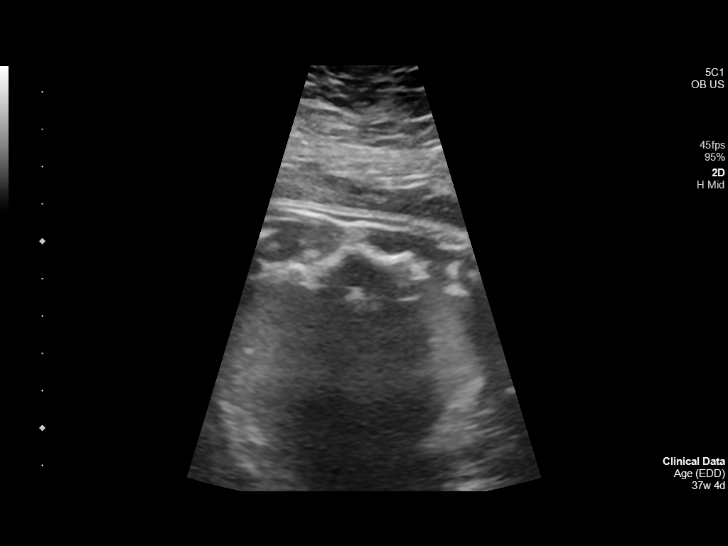
[im 38/51]
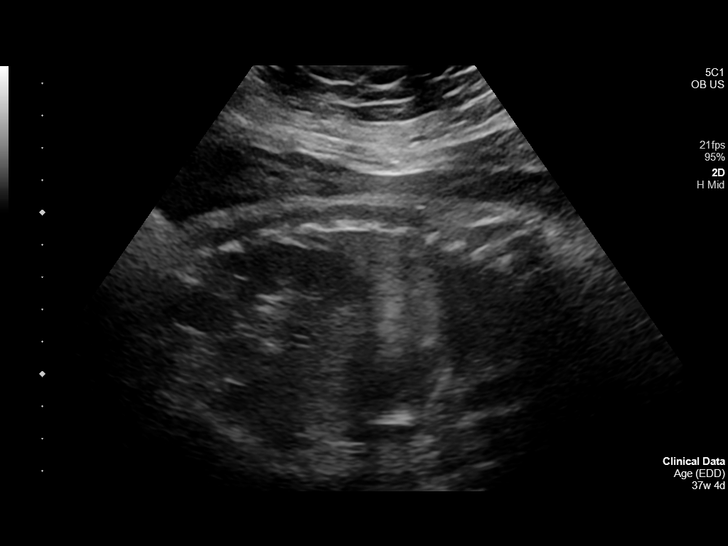
[im 41/51]
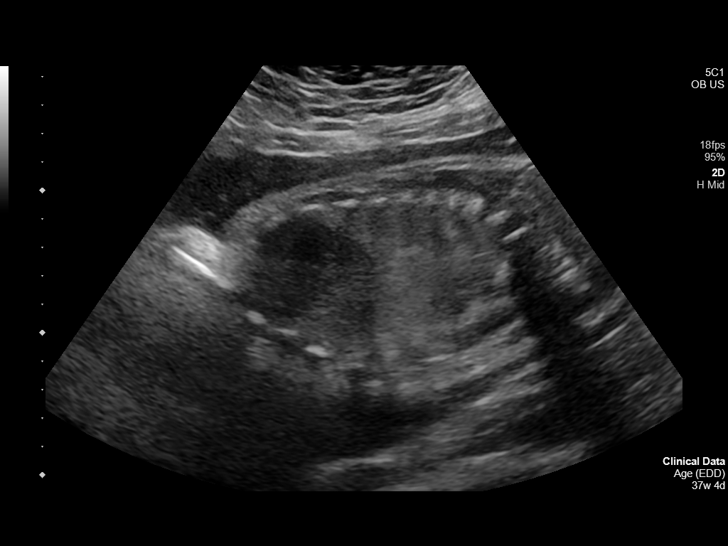
[im 45/51]
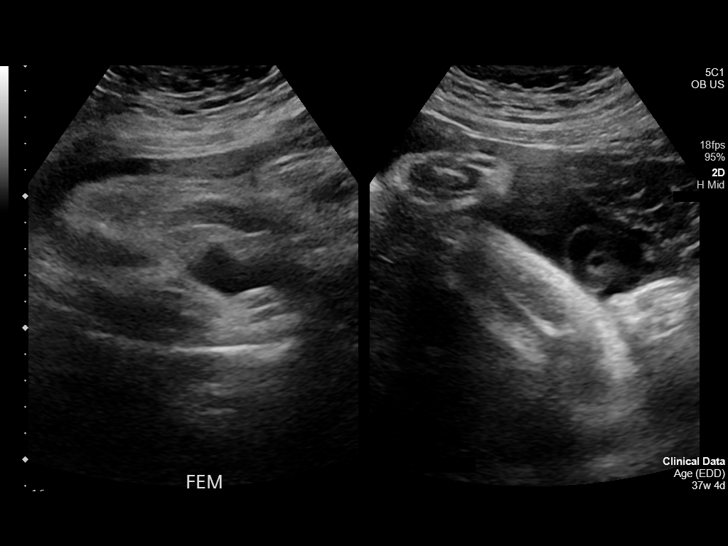
[im 49/51]
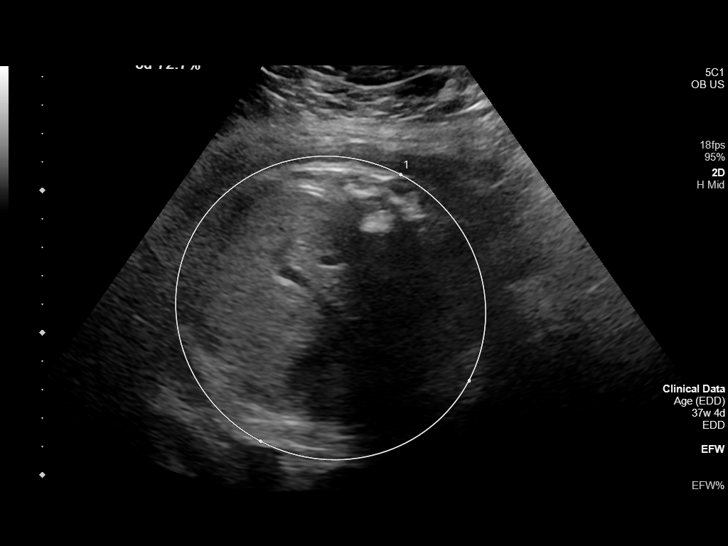

[13 of 28 positions shown; findings below may reference images not displayed]

FINDINGS: Number of Fetuses: 1

Heart Rate:  133 bpm

Movement: The yes

Presentation: Cephalic

Previa: No

Placental Location: Posterior

Amniotic Fluid (Subjective): Normal

Amniotic Fluid (Objective):

Vertical pocket = 2.8cm

AFI = 10 cm (5%ile= 7.5 cm, 95%= 24.4 cm for 37 wks)

FETAL BIOMETRY

BPD: 9.3cm 37w 5d

HC:   34cm 39w 0d

AC:   34cm 37w 6d

FL:   7.7cm 39w 1d

Current Mean GA: 38w 4d US EDC: 09/26/2020

Assigned GA:  37w 4d Assigned EDC: 10/03/2000

Estimated Fetal Weight:  3,437g 76.5%ile

FETAL ANATOMY

Lateral Ventricles: Appears normal

Thalami/CSP: Appears normal

Posterior Fossa:  Not visualized

Nuchal Region: Appears normal   NFT= N/A > 20 WKS

Upper Lip: Appears normal

Spine: Appears normal

4 Chamber Heart on Left: Appears normal

LVOT: Not well visualized

RVOT: Not visualized

Stomach on Left: Appears normal

3 Vessel Cord: Appears normal

Cord Insertion site: Appears normal

Kidneys: Appears normal

Bladder: Appears normal

Extremities: Appears normal

Sex: Not visualized on current exam

Technically difficult due to: Advanced gestational age and maternal
habitus

Maternal Findings:

No focal abnormality
IMPRESSION: Single live intrauterine pregnancy as detailed above. No focal fetal
anatomic abnormality.

## 2021-05-16 ENCOUNTER — Other Ambulatory Visit: Payer: Self-pay

## 2021-05-16 ENCOUNTER — Ambulatory Visit: Payer: Medicaid Other | Admitting: Physician Assistant

## 2021-05-16 ENCOUNTER — Encounter: Payer: Self-pay | Admitting: Physician Assistant

## 2021-05-16 DIAGNOSIS — Z113 Encounter for screening for infections with a predominantly sexual mode of transmission: Secondary | ICD-10-CM | POA: Diagnosis not present

## 2021-05-16 LAB — WET PREP FOR TRICH, YEAST, CLUE
Trichomonas Exam: NEGATIVE
Yeast Exam: NEGATIVE

## 2021-05-16 NOTE — Progress Notes (Signed)
  [facility] Department STI clinic/screening visit  Subjective:  Adrienne Clarke is a 35 y.o. female being seen today for an STI screening visit. The patient reports they do not have symptoms.  Patient reports that they do not desire a pregnancy in the next year.   They reported they are not interested in discussing contraception today.  Patient's last menstrual period was 04/30/2021.   Patient has the following medical conditions:   Patient Active Problem List   Diagnosis Date Noted  . Encounter for elective induction of labor 09/29/2020  . Antepartum oligohydramnios in third trimester, delivered, single or unspecified fetus 09/16/2020  . Obesity affecting pregnancy in third trimester 03/23/2020  . History of LEEP (loop electrosurgical excision procedure) of cervix complicating pregnancy 51/88/4166  . Supervision of high risk pregnancy, antepartum 02/10/2020  . History of cervical dysplasia 02/10/2020  . Morbid obesity with BMI of 45.0-49.9, adult [facility]) 07/14/2019    Chief Complaint  Patient presents with  . SEXUALLY TRANSMITTED DISEASE    screening    HPI  Patient reports that she is not having any symptoms but would like a screening today.  Reports that she found out that her long term partner may have had another partner and she wants to be sure she is OK.  Denies chronic conditions.  Reports taking MVI regularly, LMP 04/30/2021 and has an IUD for [facility].  Reports last HIV test and pap were both in 2021.   See flowsheet for further details and programmatic requirements.    The following portions of the patient's history were reviewed and updated as appropriate: allergies, current medications, past medical history, past social history, past surgical history and problem list.  Objective:  There were no vitals filed for this visit.  Physical Exam Constitutional:      General: She is not in acute distress.    Appearance: Normal appearance.  HENT:     Head:  Normocephalic and atraumatic.  Eyes:     Conjunctiva/sclera: Conjunctivae normal.  Pulmonary:     Effort: Pulmonary effort is normal.  Musculoskeletal:     Cervical back: Neck supple. No tenderness.  Skin:    General: Skin is warm and dry.  Neurological:     Mental Status: She is alert and oriented to person, place, and time.  Psychiatric:        Mood and Affect: Mood normal.        Behavior: Behavior normal.        Thought Content: Thought content normal.        Judgment: Judgment normal.      Assessment and Plan:  [name] is a 35 y.o. female presenting to the [facility] Department for STI screening  1. Screening for STD (sexually transmitted disease) Patient into clinic without symptoms. Patient declines pelvic by provider and opts to self collect vaginal samples.  Counseled patient re: how to collect samples for accurate results. Rec condoms with all sex. Await test results.  Counseled that RN will call if needs to RTC for treatment once results are back. - WET PREP FOR [place], YEAST, [place] [facility] - Syphilis Serology, [place] [facility]     No follow-ups on file.  No future appointments.  Jerene Dilling, PA

## 2021-05-16 NOTE — Progress Notes (Signed)
Pt here for STD screening.  Wet mounts results reviewed.  No treatment required.  Pt declined condoms.

## 2021-05-22 LAB — HM HIV SCREENING LAB: HM HIV Screening: NEGATIVE

## 2021-10-10 NOTE — Telephone Encounter (Signed)
Paragard rcvd/charged 11/21/20

## 2021-12-13 ENCOUNTER — Ambulatory Visit: Payer: Medicaid Other

## 2022-02-01 ENCOUNTER — Encounter: Payer: Self-pay | Admitting: Family Medicine

## 2022-02-01 ENCOUNTER — Other Ambulatory Visit: Payer: Self-pay

## 2022-02-01 ENCOUNTER — Ambulatory Visit: Payer: Medicaid Other | Admitting: Family Medicine

## 2022-02-01 DIAGNOSIS — Z113 Encounter for screening for infections with a predominantly sexual mode of transmission: Secondary | ICD-10-CM

## 2022-02-01 LAB — WET PREP FOR TRICH, YEAST, CLUE
Trichomonas Exam: NEGATIVE
Yeast Exam: NEGATIVE

## 2022-02-01 NOTE — Progress Notes (Signed)
Pt here for STD screening.  Wet mount results reviewed, no treatment required per SO.  Pt declined condoms.  Windle Guard, RN

## 2022-02-01 NOTE — Progress Notes (Signed)
[facility] Department  STI clinic/screening visit [place] [address] [phone]  Subjective:  Adrienne Clarke is a 36 y.o. female being seen today for an STI screening visit. The patient reports they do not have symptoms.  Patient reports that they do not desire a pregnancy in the next year.   They reported they are not interested in discussing contraception today.    Patient's last menstrual period was 01/17/2022 (exact date).   Patient has the following medical conditions:   Patient Active Problem List   Diagnosis Date Noted   Encounter for elective induction of labor 09/29/2020   Antepartum oligohydramnios in third trimester, delivered, single or unspecified fetus 09/16/2020   Obesity affecting pregnancy in third trimester 03/23/2020   History of LEEP (loop electrosurgical excision procedure) of cervix complicating pregnancy 62/83/6629   Supervision of high risk pregnancy, antepartum 02/10/2020   History of cervical dysplasia 02/10/2020   Morbid obesity with BMI of 45.0-49.9, adult ([place]) 07/14/2019    Chief Complaint  Patient presents with   SEXUALLY TRANSMITTED DISEASE    screening    HPI  Patient reports here for regular screening, denies s/sx   Last HIV test per patient/review of record was 05/16/2021 Patient reports last pap was 02/09/2020.   Screening for MPX risk: Does the patient have an unexplained rash? No Is the patient MSM? No Does the patient endorse multiple sex partners or anonymous sex partners? No Did the patient have close or sexual contact with a person diagnosed with MPX? No Has the patient traveled outside the [place] where MPX is endemic? No Is there a high clinical suspicion for MPX-- evidenced by one of the following No  -Unlikely to be chickenpox  -Lymphadenopathy  -Rash that present in same phase of evolution on any given body part See flowsheet for further details and programmatic requirements.    The  following portions of the patient's history were reviewed and updated as appropriate: allergies, current medications, past medical history, past social history, past surgical history and problem list.  Objective:  There were no vitals filed for this visit.  Physical Exam Vitals and nursing note reviewed.  Constitutional:      Appearance: Normal appearance.  HENT:     Head: Normocephalic and atraumatic.     Mouth/Throat:     Mouth: Mucous membranes are moist.     Pharynx: Oropharynx is clear. No oropharyngeal exudate or posterior oropharyngeal erythema.  Pulmonary:     Effort: Pulmonary effort is normal.  Abdominal:     General: Abdomen is flat.     Palpations: There is no mass.     Tenderness: There is no abdominal tenderness. There is no rebound.  Genitourinary:    General: Normal vulva.     Exam position: Lithotomy position.     Pubic Area: No rash or pubic lice.      Labia:        Right: No rash or lesion.        Left: No rash or lesion.      Vagina: Normal. No vaginal discharge, erythema, bleeding or lesions.     Cervix: No cervical motion tenderness, discharge, friability, lesion or erythema.     Uterus: Normal.      Adnexa: Right adnexa normal and left adnexa normal.     Rectum: Normal.     Comments: External genitalia without, lice, nits, erythema, edema , lesions or inguinal adenopathy. Vagina with normal mucosa and discharge and  pH equals 4.  Cervix without visual lesions, uterus firm, mobile, non-tender, no masses, CMT adnexal fullness or tenderness.   Lymphadenopathy:     Head:     Right side of head: No preauricular or posterior auricular adenopathy.     Left side of head: No preauricular or posterior auricular adenopathy.     Cervical: No cervical adenopathy.     Upper Body:     Right upper body: No supraclavicular or axillary adenopathy.     Left upper body: No supraclavicular or axillary adenopathy.     Lower Body: No right inguinal adenopathy. No left inguinal  adenopathy.  Skin:    General: Skin is warm and dry.     Findings: No rash.  Neurological:     Mental Status: She is alert and oriented to person, place, and time.     Assessment and Plan:  Adrienne Clarke is a 36 y.o. female presenting to the [facility] Department for STI screening  1. Screening examination for venereal disease Patient accepted all screenings including wet prep, oral, vaginal CT/GC and declines bloodwork for HIV/RPR.  Patient meets criteria for HepB screening? No. Ordered? No - does not meet criteria  Patient meets criteria for HepC screening? No. Ordered? No - does not meet criteria   Wet prep results neg    No Treatment needed  Discussed time line for State Lab results and that patient will be called with positive results and encouraged patient to call if she had not heard in 2 weeks.  Counseled to return or seek care for continued or worsening symptoms Recommended condom use with all sex  Patient is currently using  IUD  to prevent pregnancy.   - [facility] [place], [facility], [place] [facility]     Return for as needed.  Future Appointments  Date Time Provider [place]  02/01/2022  2:00 PM Junious Dresser, FNP AC-STI None    Junious Dresser, FNP

## 2022-02-13 ENCOUNTER — Other Ambulatory Visit: Payer: Self-pay

## 2022-02-14 ENCOUNTER — Other Ambulatory Visit: Payer: Self-pay

## 2022-09-05 ENCOUNTER — Encounter: Payer: Self-pay | Admitting: Nurse Practitioner

## 2022-09-05 ENCOUNTER — Ambulatory Visit (LOCAL_COMMUNITY_HEALTH_CENTER): Payer: Medicaid Other | Admitting: Nurse Practitioner

## 2022-09-05 VITALS — BP 123/86 | HR 78 | Temp 98.5°F | Ht 66.0 in | Wt 235.2 lb

## 2022-09-05 DIAGNOSIS — Z3009 Encounter for other general counseling and advice on contraception: Secondary | ICD-10-CM

## 2022-09-05 DIAGNOSIS — Z01419 Encounter for gynecological examination (general) (routine) without abnormal findings: Secondary | ICD-10-CM | POA: Diagnosis not present

## 2022-09-05 DIAGNOSIS — Z113 Encounter for screening for infections with a predominantly sexual mode of transmission: Secondary | ICD-10-CM

## 2022-09-05 LAB — WET PREP FOR TRICH, YEAST, CLUE
Trichomonas Exam: NEGATIVE
Yeast Exam: NEGATIVE

## 2022-09-05 LAB — HM HIV SCREENING LAB: HM HIV Screening: NEGATIVE

## 2022-09-05 NOTE — Progress Notes (Signed)
[facility] Main Number: [phone]    Family Planning Visit- Initial Visit  Subjective:  Adrienne Clarke is a 36 y.o.  [id number]   being seen today for an initial annual visit and to discuss reproductive life planning.  The patient is currently using IUD or IUS for pregnancy prevention. Patient reports she/her/hers  does not want a pregnancy in the next year.    she/her/hers report they are looking for a method that provides High efficacy at preventing pregnancy  Patient has the following medical conditions has Morbid obesity with BMI of 45.0-49.9, adult ([place]); History of LEEP (loop electrosurgical excision procedure) of cervix complicating pregnancy; Supervision of high risk pregnancy, antepartum; History of cervical dysplasia; Obesity affecting pregnancy in third trimester; Antepartum oligohydramnios in third trimester, delivered, single or unspecified fetus; and Encounter for elective induction of labor on their problem list.  Chief Complaint  Patient presents with   Contraception    Patient reports to clinic today for a physical and STD screening.     Body mass index is 37.96 kg/m. - Patient is eligible for diabetes screening based on BMI and age >23?  not applicable NT6R ordered? not applicable  Patient reports 1  partner/s in last year. Desires STI screening?  Yes  Has patient been screened once for HCV in the past?  No  No results found for: "HCVAB"  Does the patient have current drug use (including MJ), have a partner with drug use, and/or has been incarcerated since last result? No  If yes-- Screen for HCV through [facility]   Does the patient meet criteria for HBV testing? No  Criteria:  -Household, sexual or needle sharing contact with HBV -History of drug use -HIV positive -Those with known Hep C   Health Maintenance Due  Topic Date Due   Hepatitis C Screening  Never done    COVID-19 Vaccine (3 - Moderna series) 04/18/2020   INFLUENZA VACCINE  07/17/2022    Review of Systems  Constitutional:  Negative for chills, fever, malaise/fatigue and weight loss.  HENT:  Negative for congestion, hearing loss and sore throat.   Eyes:  Negative for blurred vision, double vision and photophobia.  Respiratory:  Negative for shortness of breath.   Cardiovascular:  Negative for chest pain.  Gastrointestinal:  Negative for abdominal pain, blood in stool, constipation, diarrhea, heartburn, nausea and vomiting.  Genitourinary:  Negative for dysuria and frequency.  Musculoskeletal:  Negative for back pain, joint pain and neck pain.  Skin:  Negative for itching and rash.  Neurological:  Negative for dizziness, weakness and headaches.  Endo/Heme/Allergies:  Does not bruise/bleed easily.  Psychiatric/Behavioral:  Negative for depression, substance abuse and suicidal ideas.     The following portions of the patient's history were reviewed and updated as appropriate: allergies, current medications, past family history, past medical history, past social history, past surgical history and problem list. Problem list updated.   See flowsheet for other program required questions.  Objective:   Vitals:   09/05/22 0957  BP: 123/86  Pulse: 78  Temp: 98.5 F (36.9 C)  Weight: 235 lb 3.2 oz (106.7 kg)  Height: '5\' 6"'$  (1.676 m)    Physical Exam Constitutional:      Appearance: Normal appearance.  HENT:     Head: Normocephalic. No abrasion, masses or laceration. Hair is normal.     Jaw: No tenderness or swelling.     Right Ear: External  ear normal.     Left Ear: External ear normal.     Nose: Nose normal.     Mouth/Throat:     Lips: Pink. No lesions.     Mouth: Mucous membranes are moist. No lacerations or oral lesions.     Dentition: No dental caries.     Tongue: No lesions.     Palate: No mass and lesions.     Pharynx: No pharyngeal swelling, oropharyngeal exudate,  posterior oropharyngeal erythema or uvula swelling.     Tonsils: No tonsillar exudate or tonsillar abscesses.     Comments: No visible signs dental caries  Eyes:     Pupils: Pupils are equal, round, and reactive to light.  Neck:     Thyroid: No thyroid mass, thyromegaly or thyroid tenderness.  Cardiovascular:     Rate and Rhythm: Normal rate and regular rhythm.  Pulmonary:     Effort: Pulmonary effort is normal.     Breath sounds: Normal breath sounds.  Chest:  Breasts:    Right: Normal. No swelling, mass, nipple discharge, skin change or tenderness.     Left: Normal. No swelling, mass, nipple discharge, skin change or tenderness.  Abdominal:     General: Abdomen is flat. Bowel sounds are normal.     Palpations: Abdomen is soft.     Tenderness: There is no abdominal tenderness. There is no rebound.  Genitourinary:    Pubic Area: No rash or pubic lice.      Labia:        Right: No rash, tenderness or lesion.        Left: No rash, tenderness or lesion.      Vagina: Normal. No vaginal discharge, erythema, tenderness or lesions.     Cervix: No cervical motion tenderness, discharge, lesion or erythema.     Uterus: Normal.      Adnexa:        Right: No tenderness.         Left: No tenderness.       Rectum: Normal.     Comments: Amount Discharge: small  Odor: No pH: greater than 4.5 Adheres to vaginal wall: No Color: red, patient on menses  Musculoskeletal:     Cervical back: Full passive range of motion without pain and normal range of motion.  Lymphadenopathy:     Cervical: No cervical adenopathy.     Right cervical: No superficial, deep or posterior cervical adenopathy.    Left cervical: No superficial, deep or posterior cervical adenopathy.     Upper Body:     Right upper body: No supraclavicular, axillary or epitrochlear adenopathy.     Left upper body: No supraclavicular, axillary or epitrochlear adenopathy.     Lower Body: No right inguinal adenopathy. No left inguinal  adenopathy.  Skin:    General: Skin is warm and dry.     Findings: No erythema, laceration, lesion or rash.  Neurological:     Mental Status: She is alert and oriented to person, place, and time.  Psychiatric:        Attention and Perception: Attention normal.        Mood and Affect: Mood normal.        Speech: Speech normal.        Behavior: Behavior normal. Behavior is cooperative.       Assessment and Plan:  CARMON BRIGANDI is a 36 y.o. female presenting to the [facility] Department for an initial annual wellness/contraceptive visit  Contraception  counseling: Reviewed options based on patient desire and reproductive life plan. Patient is interested in IUD or IUS. This was provided to the patient today.   Risks, benefits, and typical effectiveness rates were reviewed.  Questions were answered.  Written information was also given to the patient to review.    The patient will follow up in  1 years for surveillance.  The patient was told to call with any further questions, or with any concerns about this method of contraception.  Emphasized use of condoms 100% of the time for STI prevention.  Need for ECP was assessed.  ECP not offered due to continuous use of birth control.  1. Family planning counseling -37 year old female in clinic today for a physical and STD screening. -Patient desires to continue to use the Paragard as a birth control method. -ROS reviewed, no complaints today.   2. Well woman exam with routine gynecological exam -Normal well woman exam. -CBE today, next due 08/2025 -PAP due 01/2023  3. Screening examination for venereal disease -STD screening today. -Patient accepted all screenings including oral GC, vaginal CT/GC and bloodwork for HIV/RPR.  Patient meets criteria for HepB screening? No. Ordered? No, low risk  Patient meets criteria for HepC screening? No. Ordered? No - low risk   Treat wet prep per standing order Discussed time line for  State Lab results and that patient will be called with positive results and encouraged patient to call if she had not heard in 2 weeks.  Counseled to return or seek care for continued or worsening symptoms Recommended condom use with all sex  Patient is currently using  Paragard  to prevent pregnancy.    - Chlamydia/Gonorrhea [place] [facility] - HIV [place] [facility] - Syphilis Serology, [place] [facility] - WET PREP FOR [place], [facility], CLUE - Gonococcus culture  Total time spent: 30 minutes    Return in about 1 year (around 09/06/2023) for Annual well-woman exam.    Gregary Cromer, FNP

## 2022-09-05 NOTE — Progress Notes (Signed)
In House Labs WNL. Bthiele RN

## 2022-09-10 LAB — GONOCOCCUS CULTURE

## 2023-02-05 ENCOUNTER — Other Ambulatory Visit: Payer: Self-pay

## 2023-02-05 ENCOUNTER — Encounter: Payer: Self-pay | Admitting: Emergency Medicine

## 2023-02-05 ENCOUNTER — Emergency Department
Admission: EM | Admit: 2023-02-05 | Discharge: 2023-02-05 | Disposition: A | Discharge status: Home / Self Care | Payer: Medicaid Other | Attending: Emergency Medicine | Admitting: Emergency Medicine

## 2023-02-05 ENCOUNTER — Emergency Department: Payer: Medicaid Other

## 2023-02-05 DIAGNOSIS — R059 Cough, unspecified: Secondary | ICD-10-CM | POA: Diagnosis present

## 2023-02-05 DIAGNOSIS — J101 Influenza due to other identified influenza virus with other respiratory manifestations: Secondary | ICD-10-CM | POA: Diagnosis not present

## 2023-02-05 DIAGNOSIS — Z1152 Encounter for screening for COVID-19: Secondary | ICD-10-CM | POA: Diagnosis not present

## 2023-02-05 LAB — COMPREHENSIVE METABOLIC PANEL
ALT: 16 U/L (ref 0–44)
AST: 20 U/L (ref 15–41)
Albumin: 4.1 g/dL (ref 3.5–5.0)
Alkaline Phosphatase: 58 U/L (ref 38–126)
Anion gap: 10 (ref 5–15)
BUN: 6 mg/dL (ref 6–20)
CO2: 26 mmol/L (ref 22–32)
Calcium: 8.9 mg/dL (ref 8.9–10.3)
Chloride: 102 mmol/L (ref 98–111)
Creatinine, Ser: 0.86 mg/dL (ref 0.44–1.00)
GFR, Estimated: >60 mL/min (ref >60)
Glucose, Bld: 107 mg/dL — ABNORMAL HIGH (ref 70–99)
Potassium: 4.1 mmol/L (ref 3.5–5.1)
Sodium: 138 mmol/L (ref 135–145)
Total Bilirubin: 0.5 mg/dL (ref 0.3–1.2)
Total Protein: 8.3 g/dL — ABNORMAL HIGH (ref 6.5–8.1)

## 2023-02-05 LAB — RESP PANEL BY RT-PCR (RSV, FLU A&B, COVID)  RVPGX2
Influenza A by PCR: NEGATIVE
Influenza B by PCR: POSITIVE — AB
Resp Syncytial Virus by PCR: NEGATIVE
SARS Coronavirus 2 by RT PCR: NEGATIVE

## 2023-02-05 LAB — CBC WITH DIFFERENTIAL/PLATELET
Abs Immature Granulocytes: 0.01 10*3/uL (ref 0.00–0.07)
Basophils Absolute: 0 10*3/uL (ref 0.0–0.1)
Basophils Relative: 0 %
Eosinophils Absolute: 0 10*3/uL (ref 0.0–0.5)
Eosinophils Relative: 0 %
HCT: 44.5 % (ref 36.0–46.0)
Hemoglobin: 13.7 g/dL (ref 12.0–15.0)
Immature Granulocytes: 0 %
Lymphocytes Relative: 18 %
Lymphs Abs: 0.8 10*3/uL (ref 0.7–4.0)
MCH: 26.9 pg (ref 26.0–34.0)
MCHC: 30.8 g/dL (ref 30.0–36.0)
MCV: 87.3 fL (ref 80.0–100.0)
Monocytes Absolute: 0.2 10*3/uL (ref 0.1–1.0)
Monocytes Relative: 4 %
Neutro Abs: 3.6 10*3/uL (ref 1.7–7.7)
Neutrophils Relative %: 78 %
Platelets: 209 10*3/uL (ref 150–400)
RBC: 5.1 MIL/uL (ref 3.87–5.11)
RDW: 13.3 % (ref 11.5–15.5)
WBC: 4.7 10*3/uL (ref 4.0–10.5)
nRBC: 0 % (ref 0.0–0.2)

## 2023-02-05 LAB — URINALYSIS, ROUTINE W REFLEX MICROSCOPIC
Bacteria, UA: NONE SEEN
Bilirubin Urine: NEGATIVE
Glucose, UA: NEGATIVE mg/dL
Hgb urine dipstick: NEGATIVE
Ketones, ur: 5 mg/dL — AB
Leukocytes,Ua: NEGATIVE
Nitrite: NEGATIVE
Protein, ur: 30 mg/dL — AB
Specific Gravity, Urine: 1.026 (ref 1.005–1.030)
pH: 5 (ref 5.0–8.0)

## 2023-02-05 LAB — LACTIC ACID, PLASMA
Lactic Acid, Venous: 0.9 mmol/L (ref 0.5–1.9)
Lactic Acid, Venous: 0.9 mmol/L (ref 0.5–1.9)

## 2023-02-05 LAB — TROPONIN I (HIGH SENSITIVITY)
Troponin I (High Sensitivity): <2 ng/L (ref <18)
Troponin I (High Sensitivity): <2 ng/L (ref <18)

## 2023-02-05 MED ORDER — ACETAMINOPHEN 500 MG PO TABS
1000.0000 mg | ORAL_TABLET | Freq: Once | ORAL | Status: AC
  Administered 2023-02-05: 1000 mg via ORAL
  Filled 2023-02-05: qty 2

## 2023-02-05 MED ORDER — ONDANSETRON 4 MG PO TBDP: 4.0000 mg | ORAL_TABLET | Freq: Three times a day (TID) | ORAL | 0 refills | Status: AC | PRN

## 2023-02-05 MED ORDER — ALBUTEROL SULFATE HFA 108 (90 BASE) MCG/ACT IN AERS: 2.0000 | INHALATION_SPRAY | Freq: Four times a day (QID) | RESPIRATORY_TRACT | 2 refills | Status: AC | PRN

## 2023-02-05 MED ORDER — IBUPROFEN 800 MG PO TABS
800.0000 mg | ORAL_TABLET | Freq: Once | ORAL | Status: AC
  Administered 2023-02-05: 800 mg via ORAL
  Filled 2023-02-05: qty 1

## 2023-02-05 MED ORDER — BENZONATATE 100 MG PO CAPS: 100.0000 mg | ORAL_CAPSULE | Freq: Three times a day (TID) | ORAL | 0 refills | Status: AC | PRN

## 2023-02-05 MED ORDER — SODIUM CHLORIDE 0.9 % IV BOLUS
1000.0000 mL | Freq: Once | INTRAVENOUS | Status: AC
  Administered 2023-02-05: 1000 mL via INTRAVENOUS

## 2023-02-05 MED ORDER — KETOROLAC TROMETHAMINE 30 MG/ML IJ SOLN
30.0000 mg | Freq: Once | INTRAMUSCULAR | Status: AC
  Administered 2023-02-05: 30 mg via INTRAVENOUS
  Filled 2023-02-05: qty 1

## 2023-02-05 NOTE — ED Triage Notes (Addendum)
Patient to ED for mid sternal chest pain intermittently for the "past few days." States children at home had flu last week. C/o weakness and productive cough. Last took tylenol around 12.

## 2023-02-05 NOTE — ED Provider Notes (Signed)
[facility] Provider Note  Patient Contact: 8:04 PM (approximate)   History   Chest Pain   HPI  Adrienne Clarke is a 37 y.o. female presents to the emergency department with flulike symptoms.  Patient has had fever, nasal congestion, cough and rhinorrhea.  Patient's children have had similar symptoms at home.  Past medical history is unremarkable and patient has had no recent admissions.  No chest pain, chest tightness or abdominal pain.      Physical Exam   Triage Vital Signs: ED Triage Vitals  Enc Vitals Group     BP 02/05/23 1624 (!) 135/91     Pulse Rate 02/05/23 1624 (!) 129     Resp 02/05/23 1624 18     Temp 02/05/23 1624 (!) 101.2 F (38.4 C)     Temp Source 02/05/23 1624 Oral     SpO2 02/05/23 1624 98 %     Weight --      Height --      Head Circumference --      Peak Flow --      Pain Score 02/05/23 1625 10     Pain Loc --      Pain Edu? --      Excl. in [place]? --     Most recent vital signs: Vitals:   02/05/23 2234 02/05/23 2319  BP:  113/68  Pulse: (!) 115 (!) 112  Resp: 16 20  Temp: (!) 100.9 F (38.3 C) 100 F (37.8 C)  SpO2: 97% 96%     Constitutional: Alert and oriented. Patient is lying supine. Eyes: Conjunctivae are normal. PERRL. EOMI. Head: Atraumatic. ENT:      Ears: Tympanic membranes are mildly injected with mild effusion bilaterally.       Nose: No congestion/rhinnorhea.      Mouth/Throat: Mucous membranes are moist. Posterior pharynx is mildly erythematous.  Hematological/Lymphatic/Immunilogical: No cervical lymphadenopathy.  Cardiovascular: Normal rate, regular rhythm. Normal S1 and S2.  Good peripheral circulation. Respiratory: Normal respiratory effort without tachypnea or retractions. Lungs CTAB. Good air entry to the bases with no decreased or absent breath sounds. Gastrointestinal: Bowel sounds 4 quadrants. Soft and nontender to palpation. No guarding or rigidity. No palpable masses. No distention. No  CVA tenderness. Musculoskeletal: Full range of motion to all extremities. No gross deformities appreciated. Neurologic:  Normal speech and language. No gross focal neurologic deficits are appreciated.  Skin:  Skin is warm, dry and intact. No rash noted. Psychiatric: Mood and affect are normal. Speech and behavior are normal. Patient exhibits appropriate insight and judgement.    ED Results / Procedures / Treatments   Labs (all labs ordered are listed, but only abnormal results are displayed) Labs Reviewed  RESP PANEL BY RT-PCR (RSV, FLU A&B, COVID)  RVPGX2 - Abnormal; Notable for the following components:      Result Value   Influenza B by PCR POSITIVE (*)    All other components within normal limits  COMPREHENSIVE METABOLIC PANEL - Abnormal; Notable for the following components:   Glucose, Bld 107 (*)    Total Protein 8.3 (*)    All other components within normal limits  URINALYSIS, ROUTINE W REFLEX MICROSCOPIC - Abnormal; Notable for the following components:   Color, Urine YELLOW (*)    APPearance HAZY (*)    Ketones, ur 5 (*)    Protein, ur 30 (*)    All other components within normal limits  LACTIC ACID, PLASMA  LACTIC ACID, PLASMA  CBC WITH DIFFERENTIAL/PLATELET  POC URINE PREG, ED  TROPONIN I (HIGH SENSITIVITY)  TROPONIN I (HIGH SENSITIVITY)     EKG  Sinus tachycardia with no ST segment elevation or other apparent arrhythmia.   RADIOLOGY  I personally viewed and evaluated these images as part of my medical decision making, as well as reviewing the written report by the radiologist.  ED Provider Interpretation: Chest x-ray shows no acute abnormality.   PROCEDURES:  Critical Care performed: No  Procedures   MEDICATIONS ORDERED IN ED: Medications  ibuprofen (ADVIL) tablet 800 mg (800 mg Oral Given 02/05/23 1631)  sodium chloride 0.9 % bolus 1,000 mL (0 mLs Intravenous Stopped 02/05/23 2207)  sodium chloride 0.9 % bolus 1,000 mL (0 mLs Intravenous Stopped  02/05/23 2319)  ketorolac (TORADOL) 30 MG/ML injection 30 mg (30 mg Intravenous Given 02/05/23 2232)  acetaminophen (TYLENOL) tablet 1,000 mg (1,000 mg Oral Given 02/05/23 2243)     IMPRESSION / MDM / ASSESSMENT AND PLAN / ED COURSE  I reviewed the triage vital signs and the nursing notes.                              Assessment and plan:  Influenza B:   37 year old female presents to the emergency department with flulike symptoms.  Patient was tachycardic and febrile at triage but vital signs otherwise reassuring.    CBC, CMP and troponin within range.  Urinalysis shows no signs of UTI.  Patient tested positive for influenza B.  Patient's heart rate trended down with supplemental fluids given in the emergency department.   Patient was prescribed albuterol, Tessalon Perles and Zofran at discharge.  Return precautions were given to return with new or worsening symptoms.   FINAL CLINICAL IMPRESSION(S) / ED DIAGNOSES   Final diagnoses:  Influenza B     Rx / DC Orders   ED Discharge Orders          Ordered    benzonatate (TESSALON PERLES) 100 MG capsule  3 times daily PRN        02/05/23 2319    albuterol (VENTOLIN HFA) 108 (90 Base) MCG/ACT inhaler  Every 6 hours PRN        02/05/23 2319    ondansetron (ZOFRAN-ODT) 4 MG disintegrating tablet  Every 8 hours PRN        02/05/23 2319             Note:  This document was prepared using Dragon voice recognition software and may include unintentional dictation errors.   Vallarie Mare Eugene, PA-C 02/05/23 2324    Carrie Mew, MD 02/09/23 [phone]

## 2023-02-05 NOTE — ED Notes (Signed)
Grey, light green, purple, and 1 set of blood cultures sent to lab at this time.

## 2023-03-05 ENCOUNTER — Ambulatory Visit (LOCAL_COMMUNITY_HEALTH_CENTER): Payer: Medicaid Other | Admitting: Family Medicine

## 2023-03-05 ENCOUNTER — Encounter: Payer: Self-pay | Admitting: Family Medicine

## 2023-03-05 ENCOUNTER — Ambulatory Visit: Payer: Medicaid Other | Admitting: Family Medicine

## 2023-03-05 VITALS — BP 121/84 | Ht 66.0 in | Wt 240.6 lb

## 2023-03-05 DIAGNOSIS — Z30431 Encounter for routine checking of intrauterine contraceptive device: Secondary | ICD-10-CM

## 2023-03-05 DIAGNOSIS — Z3009 Encounter for other general counseling and advice on contraception: Secondary | ICD-10-CM

## 2023-03-05 DIAGNOSIS — Z113 Encounter for screening for infections with a predominantly sexual mode of transmission: Secondary | ICD-10-CM

## 2023-03-05 DIAGNOSIS — Z87410 Personal history of cervical dysplasia: Secondary | ICD-10-CM

## 2023-03-05 DIAGNOSIS — Z124 Encounter for screening for malignant neoplasm of cervix: Secondary | ICD-10-CM

## 2023-03-05 LAB — WET PREP FOR TRICH, YEAST, CLUE
Trichomonas Exam: NEGATIVE
Yeast Exam: NEGATIVE

## 2023-03-05 NOTE — Progress Notes (Signed)
[facility] Problem Visit  [facility]  Subjective:  Adrienne Clarke is a 37 y.o. being seen today for Pap smear and STI testing.  Chief Complaint  Patient presents with   Acute Visit    Pap smear and STI screening-no symptoms    HPI  Does the patient have a current or past history of drug use? No   No components found for: "HCV"]  Health Maintenance Due  Topic Date Due   Hepatitis C Screening  Never done   INFLUENZA VACCINE  07/17/2022   COVID-19 Vaccine (3 - 2023-24 season) 08/17/2022   PAP SMEAR-Modifier  02/08/2023    Review of Systems  Constitutional:  Negative for weight loss.  Eyes:  Negative for blurred vision.  Respiratory:  Negative for cough and shortness of breath.   Cardiovascular:  Negative for claudication.  Gastrointestinal:  Negative for nausea.  Genitourinary:  Negative for dysuria and frequency.  Skin:  Negative for rash.  Neurological:  Negative for headaches.  Endo/Heme/Allergies:  Does not bruise/bleed easily.    The following portions of the patient's history were reviewed and updated as appropriate: allergies, current medications, past family history, past medical history, past social history, past surgical history and problem list. Problem list updated.   See flowsheet for other program required questions.  Objective:   Vitals:   03/05/23 0856  BP: 121/84    Physical Exam Vitals and nursing note reviewed.  Constitutional:      Appearance: Normal appearance.  HENT:     Head: Normocephalic and atraumatic.     Mouth/Throat:     Mouth: Mucous membranes are moist.     Pharynx: Oropharynx is clear. No oropharyngeal exudate or posterior oropharyngeal erythema.  Pulmonary:     Effort: Pulmonary effort is normal.  Abdominal:     General: Abdomen is flat.     Palpations: There is no mass.     Tenderness: There is no abdominal tenderness. There is no rebound.  Genitourinary:    General: Normal vulva.      Exam position: Lithotomy position.     Pubic Area: No rash or pubic lice.      Labia:        Right: No rash or lesion.        Left: No rash or lesion.      Vagina: Normal. No vaginal discharge, erythema, bleeding or lesions.     Cervix: No cervical motion tenderness, discharge, friability, lesion or erythema.     Uterus: Normal.      Adnexa: Right adnexa normal and left adnexa normal.     Rectum: Normal.     Comments: pH = 4  Difficult to visualize entire cervix due to anatomy- vaginal tissue folds into cervical canal, did not visualize entire cervix- and was unable to see strings  Lymphadenopathy:     Head:     Right side of head: No preauricular or posterior auricular adenopathy.     Left side of head: No preauricular or posterior auricular adenopathy.     Cervical: No cervical adenopathy.     Upper Body:     Right upper body: No supraclavicular, axillary or epitrochlear adenopathy.     Left upper body: No supraclavicular, axillary or epitrochlear adenopathy.     Lower Body: No right inguinal adenopathy. No left inguinal adenopathy.  Skin:    General: Skin is warm and dry.     Findings: No rash.  Neurological:  Mental Status: She is alert and oriented to person, place, and time.     Assessment and Plan:  Adrienne Clarke is a 37 y.o. female presenting to the [facility] Department for a Women's Health problem visit  1. Screening for venereal disease  - Chlamydia/Gonorrhea [place], YEAST, CLUE  2. 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 IUD check up -unable to visualize strings today- difficulty with visualizing entire cervix due to anatomy- offered to do a second check and get a different speculum, patient declined today   Return if symptoms worsen or fail to improve.  Total time spent 20 minutes  Sharlet Salina, [place]

## 2023-03-05 NOTE — Progress Notes (Signed)
Pt here for repeat pap smear and STI screening.  Pt is asymptomatic.  Wet mount results reviewed with patient.  No treatment needed at this time.  Pt to contact clinic if any further questions or concerns.  Condoms declined.-Forest Becker, RN

## 2023-03-20 LAB — IGP, APTIMA HPV
HPV Aptima: NEGATIVE
PAP Smear Comment: 0

## 2024-06-08 ENCOUNTER — Ambulatory Visit: Payer: MEDICAID

## 2024-06-15 ENCOUNTER — Encounter: Payer: Self-pay | Admitting: Family Medicine

## 2024-06-15 ENCOUNTER — Ambulatory Visit: Payer: MEDICAID | Admitting: Family Medicine

## 2024-06-15 VITALS — BP 112/80 | HR 83 | Ht 66.0 in | Wt 258.4 lb

## 2024-06-15 DIAGNOSIS — Z30014 Encounter for initial prescription of intrauterine contraceptive device: Secondary | ICD-10-CM | POA: Diagnosis not present

## 2024-06-15 DIAGNOSIS — N760 Acute vaginitis: Secondary | ICD-10-CM

## 2024-06-15 DIAGNOSIS — Z309 Encounter for contraceptive management, unspecified: Secondary | ICD-10-CM | POA: Diagnosis not present

## 2024-06-15 DIAGNOSIS — Z124 Encounter for screening for malignant neoplasm of cervix: Secondary | ICD-10-CM

## 2024-06-15 DIAGNOSIS — Z113 Encounter for screening for infections with a predominantly sexual mode of transmission: Secondary | ICD-10-CM

## 2024-06-15 DIAGNOSIS — Z01419 Encounter for gynecological examination (general) (routine) without abnormal findings: Secondary | ICD-10-CM

## 2024-06-15 LAB — HM HIV SCREENING LAB: HM HIV Screening: NEGATIVE

## 2024-06-15 LAB — WET PREP FOR TRICH, YEAST, CLUE
Clue Cell Exam: POSITIVE — AB
Trichomonas Exam: NEGATIVE
Yeast Exam: NEGATIVE

## 2024-06-15 MED ORDER — METRONIDAZOLE 500 MG PO TABS: 500.0000 mg | ORAL_TABLET | Freq: Two times a day (BID) | ORAL | Status: AC

## 2024-06-15 NOTE — Progress Notes (Signed)
 [employer] [facility] DEPART[facility] [address] [place] [address] Main phone: [phone]  Family Planning Visit - Repeat Yearly Visit  Subjective:  Adrienne Clarke is a 38 y.o. [id number]  being seen today for an annual wellness visit. The patient is currently using IUD (or IUS) for pregnancy prevention. Patient does not want a pregnancy in the next year.    Patient has the following medical problems:  Patient Active Problem List   Diagnosis Date Noted   History of cervical dysplasia 02/10/2020   Chief Complaint  Patient presents with   Annual Exam    HPI Patient reports need for pap, desire for asymptomatic STI testing. Has IUD in place. No issues with periods, periods are regular and bleeding is moderate.    Review of Systems  All other systems reviewed and are negative.   See flowsheet for further details and programmatic requirements Hyperlink available at the top of the signed note in blue.  Flow sheet content below:  Pregnancy Intention Screening Does the patient want to become pregnant in the next year?: No Does the patient's partner want to become pregnant in the next year?: No Would the patient like to discuss contraceptive options today?: No Results Follow up Password: apples Is it okay to contact you by mail?: Yes Contraception History Past methods of contraception used by patient:: Intrauterine device or system (IDU,IUS), Hormonal Injection, Contraceptive Pill Adverse effects associated with Contraceptive Pill: none Adverse effects associated with Hormonal Injection: weight gain Adverse effects associated with Intrauterine device or system (IUD,IUS): none Sexual History What age did you start your period?: 15 How often do you have your period?: monthly Date of last sex?: 06/01/24 Has the patient had unprotected sex within the last 5 days?: No Do you have sex with men, women, both men and women?: Men  only In the past 2 months how many partners have you had sex with?: 1 In the past 12 months, how many partners have you had sex with?: 1 Is it possible that any of your sex partners in the past 12 months had sex with someone else whild they were still in a sexual relationship with you?: No What ways do you have sex?: Vaginal, Oral Do you or your partner use condoms and/or dental dams every time you have vaginal, oral or anal sex?: No Do you douche?: No Date of last HIV test?: 09/05/22 Have you ever had an STD?: Yes (over ten years ago) Have any of your partners had an STD?: No Have you or your partner ever shot up drugs?: No Have any of your partners used drugs in the past?: No Have you or your partners exchanged money or drugs for sex?: No Risk Factors for Hep B Household, sexual, or needle sharing contact of a person infected with Hep B: No Sexual contact with a person who uses drugs not as prescribed?: No Currently or Ever used drugs not as prescribed: No HIV Positive: No PRep Patient: No Men who have sex with men: No Have Hepatitis C: No History of Incarceration: No History of Homeslessness?: No Anal sex following anal drug use?: No Risk Factors for Hep C Currently using drugs not as prescribed: No Sexual partner(s) currently using drugs as not prescribed: No History of drug use: No HIV Positive: No People with a history of incarceration: No People born between the years of 18 and 1965: No Counseling All Patients: Use specific methods of contraceoptive and identify adverse effects (R),  Stop tobacco use, implementing the 5A counseling approach (R), Encourage mammagram for women 48 or older and younger than 50 if conditions support (R), Emergency Contraception Offered (R) if unprotected sex in past 5 days and/or propyhlactically as indicated., Provide emergency contraception counseling (R), Typical use rates for method effectiveness (R), Delay future pregnancy from 18 months to 5  years (R) at [facility] visit, Appropriate referral for additional services as needed (R) Contraception Wrap Up Current Method: IUD or IUS End Method: IUD or IUS Contraception Counseling Provided: Yes How was the end contraceptive method provided?: Provided on site  Diabetes screening This patient is 38 y.o. with a BMI of Body mass index is 41.71 kg/m.Adrienne Clarke  Is patient eligible for diabetes screening (age >35 and BMI >25)?  yes  Was Hgb A1c ordered? yes  STI screening Patient reports 1 of partners in last year.  Does this patient desire STI screening?  Yes  Hepatitis C screening Has patient been screened once for HCV in the past?  No  No results found for: HCVAB  Does the patient meet criteria for HCV testing? No  (If yes-- Screen for HCV through [facility]) Criteria:  Since the last HCV result, does the patient have any of the following? - Current drug use - Have a partner with drug use - Has been incarcerated  Hepatitis B screening Does the patient meet criteria for HBV testing? No Criteria:  -Household, sexual or needle sharing contact with HBV -History of drug use -HIV positive -Those with known Hep C  Cervical Cancer Screening  Result Date Procedure Results Follow-ups  03/05/2023 IGP, Aptima HPV DIAGNOSIS:: Comment (A) Specimen adequacy:: Comment Clinician Provided ICD10: Comment Performed by:: Comment Electronically signed by:: Comment PAP Smear Comment: . PATHOLOGIST PROVIDED ICD10:: Comment Note:: Comment Test Methodology: Comment HPV Aptima: Negative   02/09/2020 Cytology - PAP Neisseria Gonorrhea: Negative Chlamydia: Negative Adequacy: Satisfactory for evaluation; transformation zone component PRESENT. Diagnosis: - Negative for intraepithelial lesion or malignancy (NILM) Comment: Normal Reference [place] Chlamydia - Negative Comment: Normal Reference Range Neisseria Gonorrhea - Negative   02/04/2019 Surgical pathology    01/12/2019 Surgical pathology    06/12/2018  HM PAP SMEAR HM Pap smear: HSIL     Health Maintenance Due  Topic Date Due   Hepatitis C Screening  Never done   Hepatitis B Vaccines (1 of 3 - 19+ 3-dose series) Never done   HPV VACCINES (1 - 3-dose SCDM series) Never done   COVID-19 Vaccine (3 - 2024-25 season) 08/18/2023   Cervical Cancer Screening (Pap smear)  03/04/2024    The following portions of the patient's history were reviewed and updated as appropriate: allergies, current medications, past family history, past medical history, past social history, past surgical history and problem list. Problem list updated.  Objective:   Vitals:   06/15/24 1305  BP: 112/80  Pulse: 83  Weight: 258 lb 6.4 oz (117.2 kg)  Height: 5' 6 (1.676 m)    Physical Exam Vitals and nursing note reviewed. Exam conducted with a chaperone present Brett Orange with Damien Satchel [facility]).  Constitutional:      Appearance: Normal appearance.  HENT:     Head: Normocephalic and atraumatic.     Mouth/Throat:     Mouth: Mucous membranes are moist.     Pharynx: Oropharynx is clear. No oropharyngeal exudate or posterior oropharyngeal erythema.  Pulmonary:     Effort: Pulmonary effort is normal.  Chest:  Breasts:    Right: No swelling, bleeding, inverted  nipple, mass, nipple discharge, skin change or tenderness.     Left: No swelling, bleeding, inverted nipple, mass, nipple discharge, skin change or tenderness.  Abdominal:     General: Abdomen is flat.     Palpations: There is no mass.     Tenderness: There is no abdominal tenderness. There is no rebound.  Genitourinary:    General: Normal vulva.     Exam position: Lithotomy position.     Pubic Area: No rash or pubic lice.      Labia:        Right: No rash or lesion.        Left: No rash or lesion.      Vagina: Normal. No vaginal discharge, erythema, bleeding or lesions.     Cervix: No cervical motion tenderness.     Uterus: Normal.      Adnexa: Right adnexa normal and left adnexa normal.      Comments: pH = <4.5 Difficulty visualizing cervix due to anatomy and caving of vaginal walls Lymphadenopathy:     Head:     Right side of head: No preauricular or posterior auricular adenopathy.     Left side of head: No preauricular or posterior auricular adenopathy.     Cervical: No cervical adenopathy.     Upper Body:     Right upper body: No supraclavicular, axillary or epitrochlear adenopathy.     Left upper body: No supraclavicular, axillary or epitrochlear adenopathy.     Lower Body: No right inguinal adenopathy. No left inguinal adenopathy.   Skin:    General: Skin is warm and dry.     Findings: No rash.   Neurological:     Mental Status: She is alert and oriented to person, place, and time.     Assessment and Plan:  Adrienne Clarke is a 38 y.o. female [phone] presenting to the [facility] Department for an yearly wellness and contraception visit  Contraception counseling:  Reviewed options based on patient desire and reproductive life plan. Has IUD in place and happy with it.  The patient will follow up in  1 years for surveillance.  The patient was told to call with any further questions, or with any concerns about this method of contraception.  Emphasized use of condoms 100% of the time for STI prevention.  Emergency Contraception Precautions (ECP): Patient assessed for need of ECP. She is not a candidate based on LARC in place and unexpired .    1. Well woman exam with routine gynecological exam (Primary) - Difficulty visualizing cervix due to anatomy/caving of vaginal walls - CBE completed, no concerns - A1C ordered due to 38 year old w/ BMI 41  2. Screening for venereal disease  - HIV [place] [facility] - Syphilis Serology, [facility] - Chlamydia/Gonorrhea [place] [facility] - WET PREP FOR TRICH, YEAST, CLUE  3. Screening for cervical cancer - History of LEEP/CIN2 in 2020, 2024 ASCUS w/ neg HPV - IGP, Aptima HPV  4. Bacterial vaginosis -3/4 [place]  criteria - metroNIDAZOLE  (FLAGYL ) 500 MG tablet; Take 1 tablet (500 mg total) by mouth 2 (two) times daily for 7 days.    Return in about 1 year (around 06/15/2025).  No future appointments.  Verneta Bers, [place]

## 2024-06-15 NOTE — Progress Notes (Signed)
 Pt is here for family planning visit.  Family planning education card reviewed and given to pt.  Wet prep results reviewed, treatment required per standing orders. The patient was dispensed #14 metronidazole  today. I provided counseling today regarding the medication. We discussed the medication, the side effects and when to call clinic. Patient given the opportunity to ask questions. Questions answered.  Larraine JONELLE [place], RN

## 2024-06-16 ENCOUNTER — Ambulatory Visit: Payer: Self-pay | Admitting: Family Medicine

## 2024-06-16 LAB — HGB A1C W/O EAG: Hgb A1c MFr Bld: 4.9 % (ref 4.8–5.6)

## 2024-06-20 LAB — IGP, APTIMA HPV
HPV Aptima: NEGATIVE
PAP Smear Comment: 0

## 2024-07-02 ENCOUNTER — Encounter: Payer: Self-pay | Admitting: Family Medicine
